# Patient Record
Sex: Female | Born: 1985
Health system: Southern US, Community
[De-identification: ages and names within clinical notes are randomized; demographics above are authoritative.]

## PROBLEM LIST (undated history)

## (undated) ENCOUNTER — Inpatient Hospital Stay (HOSPITAL_COMMUNITY): Payer: Self-pay

## (undated) DIAGNOSIS — F419 Anxiety disorder, unspecified: Secondary | ICD-10-CM

## (undated) DIAGNOSIS — D352 Benign neoplasm of pituitary gland: Secondary | ICD-10-CM

## (undated) DIAGNOSIS — K589 Irritable bowel syndrome without diarrhea: Secondary | ICD-10-CM

## (undated) HISTORY — DX: Anxiety disorder, unspecified: F41.9

## (undated) HISTORY — PX: TONSILLECTOMY: SUR1361

## (undated) HISTORY — PX: OTHER SURGICAL HISTORY: SHX169

## (undated) HISTORY — PX: TONSILLECTOMY AND ADENOIDECTOMY: SUR1326

## (undated) HISTORY — PX: WISDOM TOOTH EXTRACTION: SHX21

## (undated) HISTORY — DX: Irritable bowel syndrome, unspecified: K58.9

---
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
## 2005-06-30 ENCOUNTER — Emergency Department (HOSPITAL_COMMUNITY): Admission: EM | Admit: 2005-06-30 | Discharge: 2005-07-01 | Payer: Self-pay | Admitting: Emergency Medicine

## 2009-02-27 ENCOUNTER — Encounter: Admission: RE | Admit: 2009-02-27 | Discharge: 2009-02-27 | Payer: Self-pay | Admitting: Orthopedic Surgery

## 2009-08-24 ENCOUNTER — Ambulatory Visit: Payer: Self-pay | Admitting: Radiology

## 2009-08-24 ENCOUNTER — Emergency Department (HOSPITAL_BASED_OUTPATIENT_CLINIC_OR_DEPARTMENT_OTHER): Admission: EM | Admit: 2009-08-24 | Discharge: 2009-08-24 | Payer: Self-pay | Admitting: Emergency Medicine

## 2014-01-31 DIAGNOSIS — R5383 Other fatigue: Secondary | ICD-10-CM | POA: Insufficient documentation

## 2015-02-26 ENCOUNTER — Encounter: Payer: Self-pay | Admitting: Internal Medicine

## 2015-03-11 ENCOUNTER — Ambulatory Visit (INDEPENDENT_AMBULATORY_CARE_PROVIDER_SITE_OTHER): Payer: Managed Care, Other (non HMO) | Admitting: Internal Medicine

## 2015-03-11 ENCOUNTER — Encounter: Payer: Self-pay | Admitting: Internal Medicine

## 2015-03-11 VITALS — BP 126/70 | HR 80 | Temp 97.1°F | Resp 16 | Ht 63.0 in | Wt 134.6 lb

## 2015-03-11 DIAGNOSIS — B079 Viral wart, unspecified: Secondary | ICD-10-CM | POA: Diagnosis not present

## 2015-03-11 DIAGNOSIS — B078 Other viral warts: Secondary | ICD-10-CM

## 2015-03-11 DIAGNOSIS — M79675 Pain in left toe(s): Secondary | ICD-10-CM

## 2015-03-11 NOTE — Progress Notes (Signed)
  Subjective:    Patient ID: Joyce Chapman, female    DOB: 11-15-1985, 29 y.o.   MRN: [id number]  HPI   Patient presents with c/o pain in the L 1st toe site of remote excision of a wart about 3 years ago. Reports has noted over 1-2 months the development of a tender lump.   On no meds & denies med allergies.   Neg PMHX.   Review of Systems 10 point systems review negative except as above.    Objective:   Physical Exam  BP 126/70 mmHg  Pulse 80  Temp(Src) 97.1 F (36.2 C)  Resp 16  Ht 5\' 3"  (1.6 m)  Wt 134 lb 9.6 oz (61.054 kg)  BMI 23.85 kg/m2  There is a pink firm exophytic 8 mm x 8 mm dermal nodule in the plantar distal L 1st toe pad.   Procedure (CPT [phone]) After informed consent and aseptic alcohol prep, the area was anesthetized with 1.5 ml lidocaine 1%. Then with a # 10 scalpel the skin lesion was excised in toto in an elliptical fashion. The wound edges were approximated and everted w/ #3 vertical mattress sutures of Nylon 3-0. Then the wound edges were aligned with #3 interrupted sutures of Nylon 3-0 . Wound was sealed with "New Skin" and sterile bandage was applied.     Assessment & Plan:   1. Pain in toe of left foot   2. Verruca   - excised   - ROV 10 days for suture removal

## 2015-03-19 ENCOUNTER — Encounter: Payer: Self-pay | Admitting: Internal Medicine

## 2015-03-19 ENCOUNTER — Ambulatory Visit (INDEPENDENT_AMBULATORY_CARE_PROVIDER_SITE_OTHER): Payer: Self-pay | Admitting: Internal Medicine

## 2015-03-19 VITALS — BP 114/72 | HR 72 | Temp 97.8°F | Resp 16 | Ht 63.0 in | Wt 136.4 lb

## 2015-03-19 DIAGNOSIS — B078 Other viral warts: Secondary | ICD-10-CM

## 2015-03-19 DIAGNOSIS — B079 Viral wart, unspecified: Secondary | ICD-10-CM

## 2015-03-19 NOTE — Progress Notes (Signed)
  Subjective:    Patient ID: Joyce Chapman, female    DOB: [birth year], 29 y.o.   MRN: [id number]  HPI 9 days s/p excision of a large verucca of the volar L 1st toe.   Review of Systems    Objective:   Physical Exam  BP 114/72 mmHg  Pulse 72  Temp(Src) 97.8 F (36.6 C)  Resp 16  Ht 5\' 3"  (1.6 m)  Wt 136 lb 6.4 oz (61.871 kg)  BMI 24.17 kg/m2  Wound appears clean w/o sign of infection, but after removal of all # 5 sutures the wound margins separated revealing healthy granulation tissue. Wound edges wer approximated with taping.     Assessment & Plan:   Healing wound L 1st toe  - Patient instructed in wound care for healing by 2sd intention  - ROV prn

## 2015-10-22 LAB — OB RESULTS CONSOLE HIV ANTIBODY (ROUTINE TESTING): HIV: NONREACTIVE

## 2015-10-22 LAB — OB RESULTS CONSOLE RPR: RPR: NONREACTIVE

## 2015-10-22 LAB — OB RESULTS CONSOLE RUBELLA ANTIBODY, IGM: RUBELLA: IMMUNE

## 2015-10-22 LAB — OB RESULTS CONSOLE ABO/RH: RH Type: POSITIVE

## 2015-10-22 LAB — OB RESULTS CONSOLE HEPATITIS B SURFACE ANTIGEN: HEP B S AG: NEGATIVE

## 2015-10-22 LAB — OB RESULTS CONSOLE ANTIBODY SCREEN: Antibody Screen: NEGATIVE

## 2015-10-27 LAB — OB RESULTS CONSOLE GC/CHLAMYDIA
CHLAMYDIA, DNA PROBE: NEGATIVE
Gonorrhea: NEGATIVE

## 2015-10-30 ENCOUNTER — Other Ambulatory Visit: Payer: Self-pay | Admitting: Obstetrics & Gynecology

## 2015-10-30 DIAGNOSIS — H539 Unspecified visual disturbance: Secondary | ICD-10-CM

## 2015-11-08 ENCOUNTER — Encounter: Payer: Self-pay | Admitting: Radiology

## 2015-11-08 ENCOUNTER — Ambulatory Visit
Admission: RE | Admit: 2015-11-08 | Discharge: 2015-11-08 | Disposition: A | Discharge status: Home / Self Care | Payer: Self-pay | Source: Ambulatory Visit | Attending: Obstetrics & Gynecology | Admitting: Obstetrics & Gynecology

## 2015-11-08 DIAGNOSIS — H539 Unspecified visual disturbance: Secondary | ICD-10-CM

## 2015-11-13 ENCOUNTER — Ambulatory Visit (HOSPITAL_COMMUNITY): Payer: Self-pay

## 2015-11-17 ENCOUNTER — Encounter (HOSPITAL_COMMUNITY): Payer: Self-pay | Admitting: *Deleted

## 2015-11-17 ENCOUNTER — Other Ambulatory Visit (HOSPITAL_COMMUNITY): Payer: Self-pay | Admitting: Obstetrics and Gynecology

## 2015-11-17 ENCOUNTER — Ambulatory Visit (HOSPITAL_COMMUNITY)
Admission: RE | Admit: 2015-11-17 | Discharge: 2015-11-17 | Disposition: A | Discharge status: Home / Self Care | Payer: 59 | Source: Ambulatory Visit | Attending: Obstetrics & Gynecology | Admitting: Obstetrics & Gynecology

## 2015-11-17 ENCOUNTER — Other Ambulatory Visit (HOSPITAL_COMMUNITY): Payer: Self-pay

## 2015-11-17 VITALS — BP 109/60 | HR 82 | Wt 140.0 lb

## 2015-11-17 DIAGNOSIS — D352 Benign neoplasm of pituitary gland: Principal | ICD-10-CM

## 2015-11-17 DIAGNOSIS — O26899 Other specified pregnancy related conditions, unspecified trimester: Secondary | ICD-10-CM | POA: Diagnosis not present

## 2015-11-17 DIAGNOSIS — D369 Benign neoplasm, unspecified site: Secondary | ICD-10-CM

## 2015-11-17 DIAGNOSIS — Z3A15 15 weeks gestation of pregnancy: Secondary | ICD-10-CM

## 2015-11-17 HISTORY — DX: Benign neoplasm of pituitary gland: D35.2

## 2015-11-17 MED ORDER — CABERGOLINE 0.5 MG PO TABS: 0.5000 mg | ORAL_TABLET | ORAL | 0 refills | Status: DC

## 2015-11-17 NOTE — Progress Notes (Signed)
MFM Consult, staff note:    By way of consultation, I spoke to your patient regarding her diagnosis of pituitary macroadenoma.  She is currently experiencing headaches and blurry vision.  Her recent MRI demonstrates a macroadenoma with a max dimension of 27mm.  She stopped her cabergoline on 08/01/15 due to concerns of risks to her baby.  She recently restarted on 11/07/15 and has 2 left over tablets but does not have a prescription/refill.    I explained to her that in general carbergoline is effective for most patients and that there is no definitive evidence of fetal harm.  I recommended that she continue the cabergoline 0.5mg  po twice weekly and refilled the prescription.    Given that I am only seeing her in consultation (ie, she feels comfortable continuing OB care under your direction), she requires referral from your office to see Endocrinology.  This will enable her to have continuity of follow up of the effectiveness of the cabergoline as dosed.  I did not place the referral so that the correspondence be made with your office.    Secondly, as a precaution, I would also recommend that she have referral to ophthalmology (fundoscopic examination) given her symptoms.  It is likely that she will respond to the cabergoline but if not or if symptoms continue then she will require referral to neurosurgery for transphenoidal resection.  I detailed this to her and assured her that with careful follow up and monitoring, the treatment will be effective and I do not foresee risk of exposure to her fetus.  That being said, I would recommend reimaging by brain MRI in 2 months to reassess the macroadenoma size and response to the cabergoline.  While I do not foresee effect on fetal growth, we typically still follow fetal growth in women on cabergoline due to theoretical risks.  On the contrary, I did explain to her that cabergoline will reduce breast milk production in the postpartum period.  While it is likely  that the macroadenoma will decrease with treatment during pregnancy along with her symptoms, I would discourage stopping the medication should her prolactinoma persist in its current stage as the risks would outweigh benefit of breastfeeding.  Should the symptoms totally resolve and the macroadenoma decrease significantly, she may consider a period of 3-6 months off of medication postpartum to facilitate breastfeeding.  However, currently she does not meet acceptable criteria to be without treatment.  Summary of Recommendations: 1. Cabergoline 0.5mg  po twice weekly; (10 tablets, no refills sent; to bridge her to Endocrinology) 2. Endocrinology and Ophthalmology consult requests by your office are recommended (otherwise, please notify our practice and we can transfer care to our [facility] location and make the referral through our [facility] Endocrinology).  The patient truly desires primiary OB follow up with your office, and I feel this is reasonable with the appropriate co-managment as delineated. 3. If symptoms progress/persist, recommend transfer of care to [facility] MFM whereby we can facilitate re-imaging, referral to Neurosurgery, endocrinology, Ophthalmology as felt appropriate at that time. 4. Interval growth q6 weeks 5. Repeat MRI in 2 months 6. Endocrinology/Opthalmology consults would be best set up in the next week or two.   7.  Our office is happy to continue to remain involved as medical consultants with the ultrasounds for co-management (ie, if desired please order follow up consultations to coincide with the ultrasounds)  Time Spent:  I spent in excess of 80 minutes in consultation with this patient to review records, evaluate  her case, and provide her with an adequate discussion and education.  More than 50% of this time was spent in direct face-to-face counseling.  It was a pleasure seeing your patient in the office today.  Thank you for consultation. Please do not hesitate to  contact our service for any further questions.  Thank you,  Delman Cheadle Harl Favor, Delman Cheadle, MD, MS, FACOG Assistant Professor Section of [place]

## 2015-11-23 ENCOUNTER — Other Ambulatory Visit: Payer: Self-pay | Admitting: Obstetrics & Gynecology

## 2015-11-23 DIAGNOSIS — D352 Benign neoplasm of pituitary gland: Secondary | ICD-10-CM

## 2015-11-25 ENCOUNTER — Encounter (HOSPITAL_COMMUNITY): Payer: Self-pay

## 2015-11-25 ENCOUNTER — Other Ambulatory Visit (HOSPITAL_COMMUNITY): Payer: Self-pay

## 2015-12-08 ENCOUNTER — Encounter (HOSPITAL_COMMUNITY): Payer: Self-pay

## 2015-12-08 ENCOUNTER — Ambulatory Visit (HOSPITAL_COMMUNITY)
Admission: RE | Admit: 2015-12-08 | Discharge: 2015-12-08 | Disposition: A | Discharge status: Home / Self Care | Payer: 59 | Source: Ambulatory Visit | Attending: Obstetrics & Gynecology | Admitting: Obstetrics & Gynecology

## 2015-12-08 ENCOUNTER — Other Ambulatory Visit (HOSPITAL_COMMUNITY): Payer: Self-pay | Admitting: Obstetrics and Gynecology

## 2015-12-08 DIAGNOSIS — Z36 Encounter for antenatal screening of mother: Secondary | ICD-10-CM | POA: Diagnosis not present

## 2015-12-08 DIAGNOSIS — D369 Benign neoplasm, unspecified site: Secondary | ICD-10-CM

## 2015-12-08 DIAGNOSIS — D352 Benign neoplasm of pituitary gland: Secondary | ICD-10-CM

## 2015-12-08 DIAGNOSIS — O2692 Pregnancy related conditions, unspecified, second trimester: Secondary | ICD-10-CM

## 2015-12-08 DIAGNOSIS — Z1389 Encounter for screening for other disorder: Secondary | ICD-10-CM

## 2015-12-08 DIAGNOSIS — Z363 Encounter for antenatal screening for malformations: Secondary | ICD-10-CM

## 2015-12-08 DIAGNOSIS — Z3A18 18 weeks gestation of pregnancy: Secondary | ICD-10-CM

## 2016-01-09 ENCOUNTER — Other Ambulatory Visit: Payer: 59

## 2016-01-13 DIAGNOSIS — D352 Benign neoplasm of pituitary gland: Secondary | ICD-10-CM | POA: Insufficient documentation

## 2016-01-13 DIAGNOSIS — E221 Hyperprolactinemia: Secondary | ICD-10-CM | POA: Insufficient documentation

## 2016-01-30 ENCOUNTER — Ambulatory Visit
Admission: RE | Admit: 2016-01-30 | Discharge: 2016-01-30 | Disposition: A | Discharge status: Home / Self Care | Payer: 59 | Source: Ambulatory Visit | Attending: Obstetrics & Gynecology | Admitting: Obstetrics & Gynecology

## 2016-01-30 DIAGNOSIS — D352 Benign neoplasm of pituitary gland: Secondary | ICD-10-CM

## 2016-02-02 ENCOUNTER — Other Ambulatory Visit (HOSPITAL_COMMUNITY): Payer: Self-pay | Admitting: Obstetrics and Gynecology

## 2016-02-02 ENCOUNTER — Ambulatory Visit (HOSPITAL_COMMUNITY)
Admission: RE | Admit: 2016-02-02 | Discharge: 2016-02-02 | Disposition: A | Discharge status: Home / Self Care | Payer: 59 | Source: Ambulatory Visit | Attending: Obstetrics & Gynecology | Admitting: Obstetrics & Gynecology

## 2016-02-02 ENCOUNTER — Encounter (HOSPITAL_COMMUNITY): Payer: Self-pay

## 2016-02-02 DIAGNOSIS — D369 Benign neoplasm, unspecified site: Secondary | ICD-10-CM

## 2016-02-02 DIAGNOSIS — D352 Benign neoplasm of pituitary gland: Principal | ICD-10-CM

## 2016-02-02 DIAGNOSIS — O2692 Pregnancy related conditions, unspecified, second trimester: Secondary | ICD-10-CM | POA: Diagnosis present

## 2016-02-02 DIAGNOSIS — Z3A26 26 weeks gestation of pregnancy: Secondary | ICD-10-CM | POA: Insufficient documentation

## 2016-03-29 ENCOUNTER — Encounter (HOSPITAL_COMMUNITY): Payer: Self-pay

## 2016-03-29 ENCOUNTER — Ambulatory Visit (HOSPITAL_COMMUNITY)
Admission: RE | Admit: 2016-03-29 | Discharge: 2016-03-29 | Disposition: A | Discharge status: Home / Self Care | Payer: 59 | Source: Ambulatory Visit | Attending: Obstetrics & Gynecology | Admitting: Obstetrics & Gynecology

## 2016-03-29 DIAGNOSIS — O2693 Pregnancy related conditions, unspecified, third trimester: Secondary | ICD-10-CM | POA: Diagnosis present

## 2016-03-29 DIAGNOSIS — Z3A34 34 weeks gestation of pregnancy: Secondary | ICD-10-CM | POA: Insufficient documentation

## 2016-03-29 DIAGNOSIS — D369 Benign neoplasm, unspecified site: Secondary | ICD-10-CM

## 2016-03-29 DIAGNOSIS — D352 Benign neoplasm of pituitary gland: Secondary | ICD-10-CM

## 2016-04-22 DIAGNOSIS — R52 Pain, unspecified: Secondary | ICD-10-CM | POA: Diagnosis not present

## 2016-04-22 DIAGNOSIS — J00 Acute nasopharyngitis [common cold]: Secondary | ICD-10-CM | POA: Diagnosis not present

## 2016-04-22 DIAGNOSIS — J029 Acute pharyngitis, unspecified: Secondary | ICD-10-CM | POA: Diagnosis not present

## 2016-05-03 ENCOUNTER — Inpatient Hospital Stay (EMERGENCY_DEPARTMENT_HOSPITAL)
Admission: AD | Admit: 2016-05-03 | Discharge: 2016-05-03 | Disposition: A | Discharge status: Home / Self Care | Payer: 59 | Source: Ambulatory Visit | Attending: Obstetrics and Gynecology | Admitting: Obstetrics and Gynecology

## 2016-05-03 ENCOUNTER — Encounter (HOSPITAL_COMMUNITY): Payer: Self-pay

## 2016-05-03 DIAGNOSIS — Z3A39 39 weeks gestation of pregnancy: Secondary | ICD-10-CM | POA: Insufficient documentation

## 2016-05-03 DIAGNOSIS — O26893 Other specified pregnancy related conditions, third trimester: Secondary | ICD-10-CM | POA: Insufficient documentation

## 2016-05-03 DIAGNOSIS — Z87891 Personal history of nicotine dependence: Secondary | ICD-10-CM | POA: Insufficient documentation

## 2016-05-03 DIAGNOSIS — O479 False labor, unspecified: Secondary | ICD-10-CM

## 2016-05-03 DIAGNOSIS — N898 Other specified noninflammatory disorders of vagina: Secondary | ICD-10-CM

## 2016-05-03 DIAGNOSIS — Z3493 Encounter for supervision of normal pregnancy, unspecified, third trimester: Secondary | ICD-10-CM | POA: Diagnosis not present

## 2016-05-03 NOTE — MAU Note (Signed)
Been having contractions since 0700, more frequent than they have been last few days. Now every 8 min.  Has been losing mucous last few days.  Pinkish fluid noted today. Was closed last week

## 2016-05-03 NOTE — Discharge Instructions (Signed)
Braxton Hicks Contractions °Contractions of the uterus can occur throughout pregnancy. Contractions are not always a sign that you are in labor.  °WHAT ARE BRAXTON HICKS CONTRACTIONS?  °Contractions that occur before labor are called Braxton Hicks contractions, or false labor. Toward the end of pregnancy (32-34 weeks), these contractions can develop more often and may become more forceful. This is not true labor because these contractions do not result in opening (dilatation) and thinning of the cervix. They are sometimes difficult to tell apart from true labor because these contractions can be forceful and people have different pain tolerances. You should not feel embarrassed if you go to the hospital with false labor. Sometimes, the only way to tell if you are in true labor is for your health care provider to look for changes in the cervix. °If there are no prenatal problems or other health problems associated with the pregnancy, it is completely safe to be sent home with false labor and await the onset of true labor. °HOW CAN YOU TELL THE DIFFERENCE BETWEEN TRUE AND FALSE LABOR? °False Labor  °· The contractions of false labor are usually shorter and not as hard as those of true labor.   °· The contractions are usually irregular.   °· The contractions are often felt in the front of the lower abdomen and in the groin.   °· The contractions may go away when you walk around or change positions while lying down.   °· The contractions get weaker and are shorter lasting as time goes on.   °· The contractions do not usually become progressively stronger, regular, and closer together as with true labor.   °True Labor  °· Contractions in true labor last 30-70 seconds, become very regular, usually become more intense, and increase in frequency.   °· The contractions do not go away with walking.   °· The discomfort is usually felt in the top of the uterus and spreads to the lower abdomen and low back.   °· True labor can be  determined by your health care provider with an exam. This will show that the cervix is dilating and getting thinner.   °WHAT TO REMEMBER °· Keep up with your usual exercises and follow other instructions given by your health care provider.   °· Take medicines as directed by your health care provider.   °· Keep your regular prenatal appointments.   °· Eat and drink lightly if you think you are going into labor.   °· If Braxton Hicks contractions are making you uncomfortable:   °¨ Change your position from lying down or resting to walking, or from walking to resting.   °¨ Sit and rest in a tub of warm water.   °¨ Drink 2-3 glasses of water. Dehydration may cause these contractions.   °¨ Do slow and deep breathing several times an hour.   °WHEN SHOULD I SEEK IMMEDIATE MEDICAL CARE? °Seek immediate medical care if: °· Your contractions become stronger, more regular, and closer together.   °· You have fluid leaking or gushing from your vagina.   °· You have a fever.   °· You pass blood-tinged mucus.   °· You have vaginal bleeding.   °· You have continuous abdominal pain.   °· You have low back pain that you never had before.   °· You feel your baby's head pushing down and causing pelvic pressure.   °· Your baby is not moving as much as it used to.   °This information is not intended to replace advice given to you by your health care provider. Make sure you discuss any questions you have with your health care   provider. °Document Released: 04/04/2005 Document Revised: 07/27/2015 Document Reviewed: 01/14/2013 °Elsevier Interactive Patient Education © 2017 [employer] Inc. ° °

## 2016-05-03 NOTE — MAU Provider Note (Signed)
History   [id number]   No chief complaint on file.   HPI Joyce Chapman is a 31 y.o. female  G1P0 here with report of watery vaginal discharge that began today.  Leaking of fluid has not continued.  Pt reports contractions and denies vaginal bleeding.    +fetal movement.   All other systems negative.    Patient's last menstrual period was 08/01/2015.  OB History  Gravida Para Term Preterm AB Living  1            SAB TAB Ectopic Multiple Live Births               # Outcome Date GA Lbr Len/2nd Weight Sex Delivery Anes PTL Lv  1 Current               Past Medical History:  Diagnosis Date  . Prolactinoma ([place])     History reviewed. No pertinent family history.  Social History   Social History  . Marital status: Single    Spouse name: N/A  . Number of children: N/A  . Years of education: N/A   Social History Main Topics  . Smoking status: Former Smoker    Packs/day: 0.05    Types: Cigarettes  . Smokeless tobacco: Never Used  . Alcohol use 0.0 oz/week     Comment: social  . Drug use: No  . Sexual activity: Yes    Birth control/ protection: None   Other Topics Concern  . None   Social History Narrative  . None    Allergies  Allergen Reactions  . Hydrocodone Itching  . Sudafed [Pseudoephedrine Hcl] Palpitations    No current facility-administered medications on file prior to encounter.    Current Outpatient Prescriptions on File Prior to Encounter  Medication Sig Dispense Refill  . Prenatal Vit-Fe Fumarate-FA (PRENATAL VITAMIN PO) Take by mouth.    . cabergoline (DOSTINEX) 0.5 MG tablet Take 1 tablet (0.5 mg total) by mouth 2 (two) times a week. (Patient not taking: Reported on 05/03/2016) 10 tablet 0  . medroxyPROGESTERone (PROVERA) 10 MG tablet Take 10 mg by mouth.       Review of Systems  Constitutional: Negative for chills and fever.  Gastrointestinal: Positive for abdominal pain. Negative for constipation, diarrhea, nausea and vomiting.   Genitourinary: Positive for vaginal discharge. Negative for dysuria and vaginal bleeding.     Physical Exam   Vitals:   05/03/16 1231 05/03/16 1317 05/03/16 1404  BP: 115/64  108/80  Pulse: 85  71  Resp: 18 18 18   Temp: 98.1 F (36.7 C)  98.3 F (36.8 C)  TempSrc: Oral  Oral  Weight: 163 lb 4 oz (74 kg)      Physical Exam  Constitutional: She is oriented to person, place, and time. She appears well-developed and well-nourished. No distress.  HENT:  Head: Normocephalic.  Cardiovascular: Normal rate and regular rhythm.   Respiratory: Effort normal. No respiratory distress.  GI: Soft. There is no tenderness.  Genitourinary:  Genitourinary Comments: Sterile speculum exam No pooling No ferning Dilation: Fingertip Effacement (%): 80 Exam by:: Hansel Feinstein CNM   Musculoskeletal: Normal range of motion.  Neurological: She is alert and oriented to person, place, and time.  Skin: Skin is warm and dry.  Psychiatric: She has a normal mood and affect.    MAU Course  Procedures  MDM Dr Helane Rima came by and saw patient Will discharge home with labor and ROM precautions  Assessment and Plan  SIUP at  [redacted]w[redacted]d Vaginal discharge, no evidence of ROM Not in labor but more effaced  Discharge home Labor precautions Follow up in office or in MAU PRN   Seabron Spates, CNM 05/03/2016 2:19 PM

## 2016-05-04 ENCOUNTER — Inpatient Hospital Stay (HOSPITAL_COMMUNITY): Payer: 59 | Admitting: Anesthesiology

## 2016-05-04 ENCOUNTER — Inpatient Hospital Stay (HOSPITAL_COMMUNITY)
Admission: AD | Admit: 2016-05-04 | Discharge: 2016-05-06 | DRG: 775 | Disposition: A | Discharge status: Home / Self Care | Payer: 59 | Source: Ambulatory Visit | Attending: Obstetrics and Gynecology | Admitting: Obstetrics and Gynecology

## 2016-05-04 ENCOUNTER — Encounter (HOSPITAL_COMMUNITY): Payer: Self-pay | Admitting: *Deleted

## 2016-05-04 DIAGNOSIS — O9962 Diseases of the digestive system complicating childbirth: Secondary | ICD-10-CM | POA: Diagnosis present

## 2016-05-04 DIAGNOSIS — Z3493 Encounter for supervision of normal pregnancy, unspecified, third trimester: Secondary | ICD-10-CM | POA: Diagnosis not present

## 2016-05-04 DIAGNOSIS — K219 Gastro-esophageal reflux disease without esophagitis: Secondary | ICD-10-CM | POA: Diagnosis not present

## 2016-05-04 DIAGNOSIS — O4292 Full-term premature rupture of membranes, unspecified as to length of time between rupture and onset of labor: Secondary | ICD-10-CM | POA: Diagnosis present

## 2016-05-04 DIAGNOSIS — Z87891 Personal history of nicotine dependence: Secondary | ICD-10-CM | POA: Diagnosis not present

## 2016-05-04 DIAGNOSIS — Z3A39 39 weeks gestation of pregnancy: Secondary | ICD-10-CM

## 2016-05-04 LAB — CBC
HEMATOCRIT: 35.3 % — AB (ref 36.0–46.0)
HEMOGLOBIN: 12.5 g/dL (ref 12.0–15.0)
MCH: 31.1 pg (ref 26.0–34.0)
MCHC: 35.4 g/dL (ref 30.0–36.0)
MCV: 87.8 fL (ref 78.0–100.0)
PLATELETS: 237 10*3/uL (ref 150–400)
RBC: 4.02 MIL/uL (ref 3.87–5.11)
RDW: 14.6 % (ref 11.5–15.5)
WBC: 16 10*3/uL — AB (ref 4.0–10.5)

## 2016-05-04 LAB — TYPE AND SCREEN
ABO/RH(D): B POS
ANTIBODY SCREEN: NEGATIVE

## 2016-05-04 LAB — ABO/RH: ABO/RH(D): B POS

## 2016-05-04 MED ORDER — ACETAMINOPHEN 325 MG PO TABS
650.0000 mg | ORAL_TABLET | ORAL | Status: DC | PRN
  Administered 2016-05-04 – 2016-05-05 (×2): 650 mg via ORAL
  Filled 2016-05-04 (×2): qty 2

## 2016-05-04 MED ORDER — EPHEDRINE 5 MG/ML INJ
10.0000 mg | INTRAVENOUS | Status: DC | PRN
  Filled 2016-05-04: qty 4

## 2016-05-04 MED ORDER — OXYTOCIN BOLUS FROM INFUSION
500.0000 mL | Freq: Once | INTRAVENOUS | Status: AC
  Administered 2016-05-04: 500 mL via INTRAVENOUS

## 2016-05-04 MED ORDER — OXYCODONE-ACETAMINOPHEN 5-325 MG PO TABS
1.0000 | ORAL_TABLET | ORAL | Status: DC | PRN
  Administered 2016-05-05 – 2016-05-06 (×6): 1 via ORAL
  Filled 2016-05-04 (×6): qty 1

## 2016-05-04 MED ORDER — SIMETHICONE 80 MG PO CHEW
80.0000 mg | CHEWABLE_TABLET | ORAL | Status: DC | PRN
  Administered 2016-05-05: 80 mg via ORAL

## 2016-05-04 MED ORDER — FENTANYL 2.5 MCG/ML BUPIVACAINE 1/10 % EPIDURAL INFUSION (WH - ANES)
14.0000 mL/h | INTRAMUSCULAR | Status: DC | PRN
  Administered 2016-05-04: 14 mL/h via EPIDURAL
  Filled 2016-05-04: qty 100

## 2016-05-04 MED ORDER — LACTATED RINGERS IV SOLN: 500.0000 mL | INTRAVENOUS | Status: DC | PRN

## 2016-05-04 MED ORDER — MEDROXYPROGESTERONE ACETATE 150 MG/ML IM SUSP: 150.0000 mg | INTRAMUSCULAR | Status: DC | PRN

## 2016-05-04 MED ORDER — DIPHENHYDRAMINE HCL 50 MG/ML IJ SOLN: 12.5000 mg | INTRAMUSCULAR | Status: DC | PRN

## 2016-05-04 MED ORDER — COCONUT OIL OIL
1.0000 "application " | TOPICAL_OIL | Status: DC | PRN
  Filled 2016-05-04: qty 120

## 2016-05-04 MED ORDER — PRENATAL MULTIVITAMIN CH
1.0000 | ORAL_TABLET | Freq: Every day | ORAL | Status: DC
  Administered 2016-05-05 – 2016-05-06 (×2): 1 via ORAL
  Filled 2016-05-04 (×2): qty 1

## 2016-05-04 MED ORDER — PHENYLEPHRINE 40 MCG/ML (10ML) SYRINGE FOR IV PUSH (FOR BLOOD PRESSURE SUPPORT)
80.0000 ug | PREFILLED_SYRINGE | INTRAVENOUS | Status: DC | PRN
  Filled 2016-05-04: qty 10
  Filled 2016-05-04: qty 5

## 2016-05-04 MED ORDER — ONDANSETRON HCL 4 MG/2ML IJ SOLN: 4.0000 mg | INTRAMUSCULAR | Status: DC | PRN

## 2016-05-04 MED ORDER — MEASLES, MUMPS & RUBELLA VAC ~~LOC~~ INJ
0.5000 mL | INJECTION | Freq: Once | SUBCUTANEOUS | Status: DC
  Filled 2016-05-04: qty 0.5

## 2016-05-04 MED ORDER — OXYTOCIN 40 UNITS IN LACTATED RINGERS INFUSION - SIMPLE MED
2.5000 [IU]/h | INTRAVENOUS | Status: DC
  Filled 2016-05-04: qty 1000

## 2016-05-04 MED ORDER — PHENYLEPHRINE 40 MCG/ML (10ML) SYRINGE FOR IV PUSH (FOR BLOOD PRESSURE SUPPORT)
80.0000 ug | PREFILLED_SYRINGE | INTRAVENOUS | Status: DC | PRN
  Filled 2016-05-04: qty 5

## 2016-05-04 MED ORDER — LIDOCAINE HCL (PF) 1 % IJ SOLN
30.0000 mL | INTRAMUSCULAR | Status: DC | PRN
  Filled 2016-05-04: qty 30

## 2016-05-04 MED ORDER — LACTATED RINGERS IV SOLN: 500.0000 mL | Freq: Once | INTRAVENOUS | Status: DC

## 2016-05-04 MED ORDER — DIBUCAINE 1 % RE OINT
1.0000 "application " | TOPICAL_OINTMENT | RECTAL | Status: DC | PRN
  Administered 2016-05-06: 1 via RECTAL
  Filled 2016-05-04: qty 28

## 2016-05-04 MED ORDER — BUTORPHANOL TARTRATE 1 MG/ML IJ SOLN
1.0000 mg | INTRAMUSCULAR | Status: DC | PRN
  Administered 2016-05-04 (×2): 1 mg via INTRAVENOUS
  Filled 2016-05-04 (×3): qty 1

## 2016-05-04 MED ORDER — ACETAMINOPHEN 325 MG PO TABS: 650.0000 mg | ORAL_TABLET | ORAL | Status: DC | PRN

## 2016-05-04 MED ORDER — IBUPROFEN 600 MG PO TABS
600.0000 mg | ORAL_TABLET | Freq: Four times a day (QID) | ORAL | Status: DC
  Administered 2016-05-05 – 2016-05-06 (×7): 600 mg via ORAL
  Filled 2016-05-04 (×7): qty 1

## 2016-05-04 MED ORDER — TETANUS-DIPHTH-ACELL PERTUSSIS 5-2.5-18.5 LF-MCG/0.5 IM SUSP: 0.5000 mL | Freq: Once | INTRAMUSCULAR | Status: DC

## 2016-05-04 MED ORDER — TERBUTALINE SULFATE 1 MG/ML IJ SOLN
0.2500 mg | Freq: Once | INTRAMUSCULAR | Status: DC | PRN
  Filled 2016-05-04: qty 1

## 2016-05-04 MED ORDER — ONDANSETRON HCL 4 MG PO TABS: 4.0000 mg | ORAL_TABLET | ORAL | Status: DC | PRN

## 2016-05-04 MED ORDER — SOD CITRATE-CITRIC ACID 500-334 MG/5ML PO SOLN: 30.0000 mL | ORAL | Status: DC | PRN

## 2016-05-04 MED ORDER — BENZOCAINE-MENTHOL 20-0.5 % EX AERO
1.0000 "application " | INHALATION_SPRAY | CUTANEOUS | Status: DC | PRN
  Administered 2016-05-05: 1 via TOPICAL
  Filled 2016-05-04 (×2): qty 56

## 2016-05-04 MED ORDER — WITCH HAZEL-GLYCERIN EX PADS
1.0000 "application " | MEDICATED_PAD | CUTANEOUS | Status: DC | PRN
  Administered 2016-05-06: 1 via TOPICAL

## 2016-05-04 MED ORDER — SENNOSIDES-DOCUSATE SODIUM 8.6-50 MG PO TABS
2.0000 | ORAL_TABLET | ORAL | Status: DC
  Administered 2016-05-05 (×2): 2 via ORAL
  Filled 2016-05-04 (×2): qty 2

## 2016-05-04 MED ORDER — ONDANSETRON HCL 4 MG/2ML IJ SOLN
4.0000 mg | Freq: Four times a day (QID) | INTRAMUSCULAR | Status: DC | PRN
  Administered 2016-05-04 (×2): 4 mg via INTRAVENOUS
  Filled 2016-05-04 (×2): qty 2

## 2016-05-04 MED ORDER — OXYTOCIN 40 UNITS IN LACTATED RINGERS INFUSION - SIMPLE MED
1.0000 m[IU]/min | INTRAVENOUS | Status: DC
  Administered 2016-05-04: 2 m[IU]/min via INTRAVENOUS

## 2016-05-04 MED ORDER — PANTOPRAZOLE SODIUM 40 MG PO TBEC
40.0000 mg | DELAYED_RELEASE_TABLET | Freq: Every day | ORAL | Status: DC
  Administered 2016-05-05 – 2016-05-06 (×2): 40 mg via ORAL
  Filled 2016-05-04 (×2): qty 1

## 2016-05-04 MED ORDER — OXYCODONE-ACETAMINOPHEN 5-325 MG PO TABS: 2.0000 | ORAL_TABLET | ORAL | Status: DC | PRN

## 2016-05-04 MED ORDER — DIPHENHYDRAMINE HCL 25 MG PO CAPS: 25.0000 mg | ORAL_CAPSULE | Freq: Four times a day (QID) | ORAL | Status: DC | PRN

## 2016-05-04 MED ORDER — FLEET ENEMA 7-19 GM/118ML RE ENEM: 1.0000 | ENEMA | RECTAL | Status: DC | PRN

## 2016-05-04 MED ORDER — LACTATED RINGERS IV SOLN
INTRAVENOUS | Status: DC
  Administered 2016-05-04: 06:00:00 via INTRAVENOUS

## 2016-05-04 MED ORDER — LIDOCAINE HCL (PF) 1 % IJ SOLN
INTRAMUSCULAR | Status: DC | PRN
  Administered 2016-05-04 (×2): 6 mL via EPIDURAL

## 2016-05-04 NOTE — Progress Notes (Signed)
Pt uncomfortable w/ ctx  FHT cat 1 Toco Q5 Cvx 8cm, no change  A/P:  Augment with pitocin Offered epidural

## 2016-05-04 NOTE — MAU Note (Signed)
Pt reports frequent contractions since 2300, light pink mucus discharge + FM

## 2016-05-04 NOTE — Anesthesia Procedure Notes (Signed)
Epidural Patient location during procedure: OB Start time: 05/04/2016 3:10 PM End time: 05/04/2016 4:25 PM  Staffing Anesthesiologist: Annye Asa Performed: anesthesiologist   Preanesthetic Checklist Completed: patient identified, surgical consent, pre-op evaluation, timeout performed, IV checked, risks and benefits discussed and monitors and equipment checked  Epidural Patient position: sitting Prep: site prepped and draped and DuraPrep Patient monitoring: blood pressure, continuous pulse ox and heart rate Approach: midline Location: L3-L4 Injection technique: LOR air  Needle:  Needle type: Tuohy  Needle gauge: 17 G Needle length: 9 cm Needle insertion depth: 5 cm Catheter type: closed end flexible Catheter size: 19 Gauge Catheter at skin depth: 10.5 cm Test dose: negative (1% lidocaine)  Assessment Events: blood not aspirated, injection not painful, no injection resistance, negative IV test and no paresthesia  Additional Notes Pt identified in Labor room.  Monitors applied. Working IV access confirmed. Sterile prep, drape lumbar spine.  1% lido local L 3,4.  #17ga Touhy LOR air at 5 cm L 3,4, cath in easily to 10.5 cm skin. Test dose OK, cath dosed and infusion begun.  Patient asymptomatic, VSS, no heme aspirated, tolerated well.  Jenita Seashore, MDReason for block:procedure for pain

## 2016-05-04 NOTE — Progress Notes (Signed)
SVD of vigorous female infant w/ apgars of 8,9.  Placenta delivered spontaneous w/ 3VC.   2nd degree & left periurethral lac repaired w/ 3-0 vicryl rapide.  Fundus firm.  EBL 250cc .

## 2016-05-04 NOTE — Progress Notes (Signed)
As I was performing epidural/pain rounds on L&D, I was asked by the L&D RN to not visit this patient, as she did not want to hear anything about an epidural.  I therefore did not visit this patient.  I told the L&D RN to call me/anesthesia if the patient changes her mind and wants to talk to anesthesia or learn about epidural.  Ladora Daniel CRNA

## 2016-05-04 NOTE — Progress Notes (Signed)
Pt uncomfortable w/ ctx.  Declines epidural.    FHT cat 1 Toco q4-6 Cvx 8/c/0 AROM - clear  A/P:  Continue exp mngt

## 2016-05-04 NOTE — MAU Note (Signed)
PT   SAYS SHE HAS BEEN UC   HURTING  BAD  SINCE  12MN.  VE IN OFFICE   WAS  1  CM.         HAS HX OF  HSV-  LAST OUTBREAK-  OVER  1 YEAR-   TAKES  VALTREX  DAILY  .   DENIES MRSA.     GBS-  NEG

## 2016-05-04 NOTE — Progress Notes (Signed)
MOB has been up twice to void. MOB is steady on her feet and denies dizzy/light headedness.

## 2016-05-04 NOTE — Anesthesia Preprocedure Evaluation (Signed)
Anesthesia Evaluation  Patient identified by MRN, date of birth, ID band Patient awake    Reviewed: Allergy & Precautions, NPO status , Patient's Chart, lab work & pertinent test results  History of Anesthesia Complications Negative for: history of anesthetic complications  Airway Mallampati: II  TM Distance: >3 FB Neck ROM: Full    Dental  (+) Dental Advisory Given   Pulmonary former smoker,    breath sounds clear to auscultation       Cardiovascular negative cardio ROS   Rhythm:Regular Rate:Normal     Neuro/Psych H/o prolactinoma: 1 cm, no optic sx or headaches, no n/v    GI/Hepatic Neg liver ROS, GERD  Medicated and Controlled,  Endo/Other  negative endocrine ROS  Renal/GU negative Renal ROS     Musculoskeletal   Abdominal   Peds  Hematology plt 237K    Anesthesia Other Findings   Reproductive/Obstetrics (+) Pregnancy                            Anesthesia Physical Anesthesia Plan  ASA: III  Anesthesia Plan: Epidural   Post-op Pain Management:    Induction:   Airway Management Planned: Natural Airway  Additional Equipment:   Intra-op Plan:   Post-operative Plan:   Informed Consent: I have reviewed the patients History and Physical, chart, labs and discussed the procedure including the risks, benefits and alternatives for the proposed anesthesia with the patient or authorized representative who has indicated his/her understanding and acceptance.     Plan Discussed with:   Anesthesia Plan Comments: (Patient identified. Risks/Benefits/Options discussed with patient including but not limited to bleeding, infection, nerve damage, paralysis, failed block, incomplete pain control, headache, blood pressure changes, nausea, vomiting, reactions to medication both or allergic, itching and postpartum back pain. Confirmed with bedside nurse the patient's most recent platelet count.  Confirmed with patient that they are not currently taking any anticoagulation, have any bleeding history or any family history of bleeding disorders. Pt has no symptoms of elevated ICP. Patient expressed understanding and wished to proceed. All questions were answered. )        Anesthesia Quick Evaluation

## 2016-05-04 NOTE — Progress Notes (Signed)
Pt a little more comfortable w/ ctx but feeling more pressure.  Has epidural now  FHT cat1 Toco Q2-4 Cvx 9.5/c/0  A/P:  Continue pitocin augmentation Exp mngt

## 2016-05-04 NOTE — H&P (Signed)
Joyce Chapman is a 31 y.o. female presenting for SOL.  Pt notes regular, painful contractions.  SROM following admission for clear, blood tinged fluid.    OB History    Gravida Para Term Preterm AB Living   1             SAB TAB Ectopic Multiple Live Births                 Past Medical History:  Diagnosis Date  . Prolactinoma [facility])    Past Surgical History:  Procedure Laterality Date  . TONSILECTOMY/ADENOIDECTOMY WITH MYRINGOTOMY    . WISDOM TOOTH EXTRACTION     Family History: family history is not on file. Social History:  reports that she has quit smoking. Her smoking use included Cigarettes. She smoked 0.05 packs per day. She has never used smokeless tobacco. She reports that she drinks alcohol. She reports that she does not use drugs.     Maternal Diabetes: No Genetic Screening: Declined Maternal Ultrasounds/Referrals: Normal Fetal Ultrasounds or other Referrals:  None, referred to MFM Maternal Substance Abuse:  No Significant Maternal Medications:  Meds include: Other: carbergoline Significant Maternal Lab Results:  None Other Comments:  None  ROS History Dilation: 5 Effacement (%): 90 Station: -1 Exam by:: Dr. Julien Girt Blood pressure 128/69, pulse 83, temperature 98.5 F (36.9 C), temperature source Axillary, resp. rate 20, height 5\' 3"  (1.6 m), weight 162 lb (73.5 kg), last menstrual period 08/01/2015, SpO2 97 %. Exam Physical Exam  Prenatal labs: ABO, Rh: --/--/B POS (01/17 0527) Antibody: NEG (01/17 0527) Rubella: Immune (07/06 0000) RPR: Nonreactive (07/06 0000)  HBsAg: Negative (07/06 0000)  HIV: Non-reactive (07/06 0000)  GBS:   negative  Assessment/Plan: Admit Epidural prn Exp mngt   Joyce Chapman 05/04/2016, 9:16 AM

## 2016-05-05 LAB — CBC
HCT: 30.2 % — ABNORMAL LOW (ref 36.0–46.0)
Hemoglobin: 10.4 g/dL — ABNORMAL LOW (ref 12.0–15.0)
MCH: 30.7 pg (ref 26.0–34.0)
MCHC: 34.4 g/dL (ref 30.0–36.0)
MCV: 89.1 fL (ref 78.0–100.0)
PLATELETS: 185 10*3/uL (ref 150–400)
RBC: 3.39 MIL/uL — ABNORMAL LOW (ref 3.87–5.11)
RDW: 15 % (ref 11.5–15.5)
WBC: 15 10*3/uL — AB (ref 4.0–10.5)

## 2016-05-05 LAB — RPR: RPR Ser Ql: NONREACTIVE

## 2016-05-05 NOTE — Progress Notes (Signed)
S: No complaints. Feeling well. Lochia appropriate. No subjective fevers/chills.   O:  Vitals:   05/04/16 2159 05/05/16 0346  BP: 108/61 (!) 112/51  Pulse: 87 70  Resp: 16 18  Temp: 98 F (36.7 C) 98.6 F (37 C)    Gen: NAD, A&O Pulm: NWOB Abd: soft, appropriately ttp, fundus firm and below Umb Ext: No evidence of DVT, trace edema b/l  Labs CBC    Component Value Date/Time   WBC 15.0 (H) 05/05/2016 0607   RBC 3.39 (L) 05/05/2016 0607   HGB 10.4 (L) 05/05/2016 0607   HCT 30.2 (L) 05/05/2016 0607   PLT 185 05/05/2016 0607   MCV 89.1 05/05/2016 0607   MCH 30.7 05/05/2016 0607   MCHC 34.4 05/05/2016 0607   RDW 15.0 05/05/2016 0607     A/P:  PPD1 s/p SVD, doing well pp. AFVSS. Benign exam.  Continue present care. Plan for d/c PPD#2.   Lucillie Garfinkel MD

## 2016-05-05 NOTE — Lactation Note (Signed)
This note was copied from a baby's chart. Lactation Consultation Note When [place] entered rm. Baby laying in crib w/no blanket on baby. Skin cool. Temp. 97.6 educated mom about temperature of baby. Mom stated FOB said no blankets can be on baby in crib. Spoke w/Rn about no coverings, and need of DEBP set up to induce lactation.  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 Mom will only be able to BF until symptoms show of tumor returning such as severe headaches then will have to stop BF.  Hand expression taught w/glisnening of colostrum. Mom encouraged to feed baby 8-12 times/24 hours and with feeding cues. Encouraged to call for assistance if needed and to verify proper latch. [place] brochure given w/resources, support groups and [place] services.  Patient Name: Joyce Chapman Today's Date: 05/05/2016 Reason for consult: Initial assessment   Maternal Data Has patient been taught Hand Expression?: Yes Does the patient have breastfeeding experience prior to this delivery?: No  Feeding    LATCH Score/Interventions Latch: Too sleepy or reluctant, no latch achieved, no sucking elicited. Intervention(s): Teach feeding cues;Waking techniques     Type of Nipple: Everted at rest and after stimulation  Comfort (Breast/Nipple): Filling, red/small blisters or bruises, mild/mod discomfort  Interventions (Filling): Massage        Lactation Tools Discussed/Used WIC Program: Yes   Consult Status Consult Status: Follow-up Date: 05/05/16 Follow-up type: In-patient    Theodoro Kalata 05/05/2016, 4:43 AM

## 2016-05-05 NOTE — Lactation Note (Signed)
This note was copied from a baby's chart. Lactation Consultation Note  Patient Name: Joyce Chapman [id number] Date: 05/05/2016 Reason for consult: Follow-up assessment Baby giving some feeding ques so assisted Mom with latching baby. Baby had few good suckling bursts then fell asleep, had recently BF for 20 minutes. Basic teaching reviewed with parents. Advised baby should be at breast 8-12 times in 24 hours and with feeding ques, cluster feeding discussed. Encouraged to keep baby nursing for 15-20 minutes both breasts some feedings. Mom denies any tenderness with nursing. Questions answered. Mom to call for assist as needed.   Maternal Data    Feeding Feeding Type: Breast Fed Length of feed: 3 min  LATCH Score/Interventions Latch: Grasps breast easily, tongue down, lips flanged, rhythmical sucking. Intervention(s): Skin to skin;Teach feeding cues;Waking techniques Intervention(s): Adjust position;Assist with latch;Breast massage;Breast compression  Audible Swallowing: None Intervention(s): Skin to skin;Hand expression  Type of Nipple: Everted at rest and after stimulation  Comfort (Breast/Nipple): Soft / non-tender  Interventions (Filling): Massage  Hold (Positioning): Assistance needed to correctly position infant at breast and maintain latch. Intervention(s): Breastfeeding basics reviewed;Support Pillows;Position options;Skin to skin  LATCH Score: 7  Lactation Tools Discussed/Used     Consult Status Consult Status: Follow-up Date: 05/06/16 Follow-up type: In-patient    Katrine Coho 05/05/2016, 10:00 PM

## 2016-05-05 NOTE — Anesthesia Postprocedure Evaluation (Signed)
Anesthesia Post Note  Patient: Joyce Chapman  Procedure(s) Performed: * No procedures listed *  Patient location during evaluation: Mother Baby Anesthesia Type: Epidural Level of consciousness: awake Pain management: pain level controlled Vital Signs Assessment: post-procedure vital signs reviewed and stable Respiratory status: spontaneous breathing Cardiovascular status: stable Postop Assessment: no headache, no backache, epidural receding, patient able to bend at knees, no signs of nausea or vomiting and adequate PO intake Anesthetic complications: no        Last Vitals:  Vitals:   05/04/16 2159 05/05/16 0346  BP: 108/61 (!) 112/51  Pulse: 87 70  Resp: 16 18  Temp: 36.7 C 37 C    Last Pain:  Vitals:   05/05/16 0641  TempSrc:   PainSc: 2    Pain Goal: Patients Stated Pain Goal: 7 (05/04/16 2159)               Barkley Boards

## 2016-05-06 MED ORDER — IBUPROFEN 600 MG PO TABS: 600.0000 mg | ORAL_TABLET | Freq: Four times a day (QID) | ORAL | 1 refills | Status: DC | PRN

## 2016-05-06 MED ORDER — ACETAMINOPHEN 325 MG PO TABS: 650.0000 mg | ORAL_TABLET | Freq: Four times a day (QID) | ORAL | 1 refills | Status: DC | PRN

## 2016-05-06 MED ORDER — OXYCODONE-ACETAMINOPHEN 5-325 MG PO TABS: 1.0000 | ORAL_TABLET | Freq: Four times a day (QID) | ORAL | 0 refills | Status: DC | PRN

## 2016-05-06 NOTE — Lactation Note (Signed)
This note was copied from a baby's chart. Lactation Consultation Note Baby has bruising to head. Bili serum drawn this am. Slightly jaundice in appearance. Baby had 7 stools, 2 voids in 30 hrs. Of life. Had 6% weight loss. Baby is BF at intervals w/o aggression. Discussed jaundice, cluster feeding, I&O, and need to wake baby for feedings, alert RN if baby is cont. To miss feedings. Discussed spoon feeding for stimulation and possible supplementing for stimulation w/colostrum. Mom encouraged to feed baby 8-12 times/24 hours and with feeding cues. Mom shown how to use DEBP & how to disassemble, clean, & reassemble parts. Mom knows to pump q3h for 15-20 min. LC hand expressed Rt. Breast w/no colostrum noted. Mom states she has seen colostrum. Mom used her personal DEBP today and saw glistening of colostrum.  Mom has compressible short shaft nipples. Gave shells to wear in bra to assist in everting more.   Mom trying to latch baby in cradle position when [place] entered rm. Assisted in football position. Baby BF w/non-nutritive suckling. Attempted to stimulate sleepy baby.  Reported to RN.   Patient Name: Joyce Chapman Today's Date: 05/06/2016 Reason for consult: Follow-up assessment;Infant weight loss;Hyperbilirubinemia   Maternal Data    Feeding Feeding Type: Breast Fed Length of feed: 5 min  LATCH Score/Interventions Latch: Too sleepy or reluctant, no latch achieved, no sucking elicited. Intervention(s): Skin to skin;Teach feeding cues;Waking techniques Intervention(s): Adjust position;Assist with latch;Breast massage;Breast compression  Audible Swallowing: None Intervention(s): Skin to skin;Hand expression  Type of Nipple: Everted at rest and after stimulation  Comfort (Breast/Nipple): Soft / non-tender  Interventions (Filling): Double electric pump  Hold (Positioning): Assistance needed to correctly position infant at breast and maintain latch. Intervention(s): Breastfeeding basics  reviewed;Support Pillows;Position options;Skin to skin  LATCH Score: 5  Lactation Tools Discussed/Used Tools: Shells;Pump Shell Type: Inverted Breast pump type: Double-Electric Breast Pump Pump Review: Setup, frequency, and cleaning;Milk Storage Initiated by:: Allayne Stack RN IBCLC Date initiated:: 05/06/16   Consult Status Consult Status: Follow-up Date: 05/06/16 Follow-up type: In-patient    Joyce Chapman, Elta Guadeloupe 05/06/2016, 4:02 AM

## 2016-05-06 NOTE — Discharge Instructions (Signed)
No vaginal entry No operation automobiles No heavy lifting

## 2016-05-06 NOTE — Progress Notes (Signed)
Post Partum Day 2 Subjective: no complaints, up ad lib, voiding, tolerating PO and + flatus  Objective: Blood pressure 123/69, pulse 67, temperature 98.3 F (36.8 C), temperature source Oral, resp. rate 18, height 5\' 3"  (1.6 m), weight 162 lb (73.5 kg), last menstrual period 08/01/2015, SpO2 98 %, unknown if currently breastfeeding.  Physical Exam:  General: alert, cooperative and no distress Lochia: appropriate Uterine Fundus: firm Incision: healing well DVT Evaluation: No evidence of DVT seen on physical exam.   Recent Labs  05/04/16 0527 05/05/16 0607  HGB 12.5 10.4*  HCT 35.3* 30.2*    Assessment/Plan: Discharge home   LOS: 2 days   Shareef Eddinger II,Sumit Branham E 05/06/2016, 8:43 AM

## 2016-05-06 NOTE — Progress Notes (Signed)
Discharge education complete, discharge instructions and follow up appointment discussed patient verbalized understanding. Patient will be rooming in with infant.

## 2016-05-06 NOTE — Lactation Note (Signed)
This note was copied from a baby's chart. Lactation Consultation Note  Patient Name: Joyce Chapman [id number] Date: 05/06/2016 Reason for consult: Follow-up assessment Follow up visit made.  Mom is currently nursing baby using football hold.  Baby needs good stimulation to nurse actively.  Mom using good breast massage during feeding.  When baby came off I assisted mom with latching baby to left breast using football hold.  Baby opens wide and latched after a few attempts.  Baby very sleepy on this breast.  Discussed cluster feeding. Mom has pumped a few times and obtained drops.  Recommended she continue to post pump to establish milk supply.  Encouraged to call for assist/concerns prn.  Maternal Data    Feeding Feeding Type: Breast Fed Length of feed: 15 min  LATCH Score/Interventions Latch: Grasps breast easily, tongue down, lips flanged, rhythmical sucking. Intervention(s): Skin to skin;Teach feeding cues;Waking techniques Intervention(s): Adjust position;Assist with latch;Breast massage;Breast compression  Audible Swallowing: A few with stimulation  Type of Nipple: Everted at rest and after stimulation  Comfort (Breast/Nipple): Soft / non-tender     Hold (Positioning): Assistance needed to correctly position infant at breast and maintain latch. Intervention(s): Breastfeeding basics reviewed;Support Pillows;Position options;Skin to skin  LATCH Score: 8  Lactation Tools Discussed/Used     Consult Status Consult Status: Follow-up Date: 05/07/16 Follow-up type: In-patient    Ave Filter 05/06/2016, 3:47 PM

## 2016-05-06 NOTE — Discharge Summary (Signed)
Obstetric Discharge Summary Reason for Admission: onset of labor Prenatal Procedures: none Intrapartum Procedures: spontaneous vaginal delivery Postpartum Procedures: none Complications-Operative and Postpartum: none Hemoglobin  Date Value Ref Range Status  05/05/2016 10.4 (L) 12.0 - 15.0 g/dL Final   HCT  Date Value Ref Range Status  05/05/2016 30.2 (L) 36.0 - 46.0 % Final    Physical Exam:  General: alert, cooperative and no distress Lochia: appropriate Uterine Fundus: firm Incision: healing well DVT Evaluation: No evidence of DVT seen on physical exam.  Discharge Diagnoses: Term Pregnancy-delivered  Discharge Information: Date: 05/06/2016 Activity: pelvic rest Diet: routine Medications: PNV, Ibuprofen and Percocet Condition: stable Instructions: refer to practice specific booklet Discharge to: home   Newborn Data: Live born female  Birth Weight: 7 lb 6.8 oz (3368 g) APGAR: 9, 8  Home with mother.  Lliam Hoh II,Oscar Hank E 05/06/2016, 8:50 AM

## 2016-05-07 ENCOUNTER — Ambulatory Visit: Payer: Self-pay

## 2016-05-07 NOTE — Lactation Note (Signed)
This note was copied from a baby's chart. Lactation Consultation Note  Patient Name: Joyce Chapman [id number] Date: 05/07/2016 Reason for consult: Follow-up assessment;Hyperbilirubinemia;Infant weight loss Baby will be discharged today.  F/U Bili check tomorrow and pedi check 05/09/16.  Instructed to give up to 30 mls of formula every 3 hours.  Mom and FOB independently used 5 french feeding tube/syringe at breast.  Mom will continue post pumping.  Instructed to use a bottle for supplement if feeding tube becomes difficult.  Offered and OP appointment and mom will call if she desires more help.  Maternal Data    Feeding Feeding Type: Formula Length of feed: 20 min  LATCH Score/Interventions Latch: Grasps breast easily, tongue down, lips flanged, rhythmical sucking. Intervention(s): Skin to skin Intervention(s): Adjust position;Assist with latch;Breast massage;Breast compression  Audible Swallowing: Spontaneous and intermittent (with supplement at breast)  Type of Nipple: Everted at rest and after stimulation  Comfort (Breast/Nipple): Soft / non-tender     Hold (Positioning): No assistance needed to correctly position infant at breast. Intervention(s): Breastfeeding basics reviewed;Support Pillows;Skin to skin  LATCH Score: 10  Lactation Tools Discussed/Used Tools: 57F feeding tube / Syringe   Consult Status      Ave Filter 05/07/2016, 1:37 PM

## 2016-05-07 NOTE — Lactation Note (Signed)
This note was copied from a baby's chart. Lactation Consultation Note  Patient Name: Joyce Chapman [id number] Date: 05/07/2016 Reason for consult: Follow-up assessment;Hyperbilirubinemia;Infant weight loss Follow up visit made this AM.  Discussed baby's weight loss and bilirubin rising.  Baby cluster fed last night but she also has marks on her chin from pacifier use. Baby voiding well but stools are infrequent.  Recommended supplementing at breast with formula.  Mom just pumped but no milk obtained.  Mom is agreeable to supplementation.  Assisted with latching baby using a 5 french feeding tube and syringe at breast.  Baby latched easily and well and took 12 mls of alimentum.  Mom instructed to breastfeed with cues and supplement with 12-24 mls at breast every 3 hours.  Post pump every 3 hours for 15 minutes.  My phone number left with mom for assist with next feeding.  Report given to [facility] RN.  Maternal Data    Feeding Feeding Type: Formula Length of feed: 20 min  LATCH Score/Interventions Latch: Grasps breast easily, tongue down, lips flanged, rhythmical sucking. Intervention(s): Skin to skin Intervention(s): Adjust position;Assist with latch;Breast massage;Breast compression  Audible Swallowing: Spontaneous and intermittent (with supplement at breast)  Type of Nipple: Everted at rest and after stimulation  Comfort (Breast/Nipple): Soft / non-tender     Hold (Positioning): No assistance needed to correctly position infant at breast. Intervention(s): Breastfeeding basics reviewed;Support Pillows;Skin to skin  LATCH Score: 10  Lactation Tools Discussed/Used Tools: 37F feeding tube / Syringe   Consult Status      Ave Filter 05/07/2016, 10:58 AM

## 2016-08-22 DIAGNOSIS — E221 Hyperprolactinemia: Secondary | ICD-10-CM | POA: Diagnosis not present

## 2016-08-22 DIAGNOSIS — R6889 Other general symptoms and signs: Secondary | ICD-10-CM | POA: Diagnosis not present

## 2016-08-22 DIAGNOSIS — R42 Dizziness and giddiness: Secondary | ICD-10-CM | POA: Diagnosis not present

## 2016-08-22 DIAGNOSIS — D352 Benign neoplasm of pituitary gland: Secondary | ICD-10-CM | POA: Diagnosis not present

## 2016-09-26 DIAGNOSIS — E221 Hyperprolactinemia: Secondary | ICD-10-CM | POA: Diagnosis not present

## 2016-12-01 DIAGNOSIS — D352 Benign neoplasm of pituitary gland: Secondary | ICD-10-CM | POA: Diagnosis not present

## 2016-12-01 DIAGNOSIS — E221 Hyperprolactinemia: Secondary | ICD-10-CM | POA: Diagnosis not present

## 2017-01-16 DIAGNOSIS — E221 Hyperprolactinemia: Secondary | ICD-10-CM | POA: Diagnosis not present

## 2017-01-16 DIAGNOSIS — D352 Benign neoplasm of pituitary gland: Secondary | ICD-10-CM | POA: Diagnosis not present

## 2017-04-06 DIAGNOSIS — D352 Benign neoplasm of pituitary gland: Secondary | ICD-10-CM | POA: Diagnosis not present

## 2017-04-06 DIAGNOSIS — E221 Hyperprolactinemia: Secondary | ICD-10-CM | POA: Diagnosis not present

## 2017-04-18 NOTE — L&D Delivery Note (Signed)
Delivery Note At 1:01 PM a viable female was delivered via Vaginal, Spontaneous (Presentation: LOA).  APGAR: 8, 9; weight pending.   Placenta status: S, I. 3V Cord with the following complications: none.  Cord pH: n/a  Anesthesia:  CLEA Episiotomy: None Lacerations: 2nd degree;Perineal Suture Repair: 3.0 vicryl rapide Est. Blood Loss (mL):  250  Mom to postpartum.  Baby to Couplet care / Skin to Skin.  Steele Ledonne, [place] 03/08/2018, 1:21 PM

## 2017-06-05 DIAGNOSIS — E221 Hyperprolactinemia: Secondary | ICD-10-CM | POA: Diagnosis not present

## 2017-06-06 DIAGNOSIS — Z01419 Encounter for gynecological examination (general) (routine) without abnormal findings: Secondary | ICD-10-CM | POA: Diagnosis not present

## 2017-06-06 DIAGNOSIS — Z6824 Body mass index (BMI) 24.0-24.9, adult: Secondary | ICD-10-CM | POA: Diagnosis not present

## 2017-07-26 ENCOUNTER — Ambulatory Visit: Payer: 59 | Admitting: Physician Assistant

## 2017-07-26 ENCOUNTER — Encounter: Payer: Self-pay | Admitting: Physician Assistant

## 2017-07-26 ENCOUNTER — Encounter: Payer: Self-pay | Admitting: Gastroenterology

## 2017-07-26 VITALS — BP 110/60 | HR 81 | Temp 97.4°F | Resp 18 | Ht 63.0 in | Wt 141.6 lb

## 2017-07-26 DIAGNOSIS — R945 Abnormal results of liver function studies: Secondary | ICD-10-CM | POA: Diagnosis not present

## 2017-07-26 DIAGNOSIS — Z130 Encounter for screening for diseases of the blood and blood-forming organs and certain disorders involving the immune mechanism: Secondary | ICD-10-CM

## 2017-07-26 DIAGNOSIS — K5904 Chronic idiopathic constipation: Secondary | ICD-10-CM

## 2017-07-26 DIAGNOSIS — R7989 Other specified abnormal findings of blood chemistry: Secondary | ICD-10-CM

## 2017-07-26 NOTE — Progress Notes (Signed)
Subjective:    Patient ID: Joaquin Bend, female    DOB: [birth year], 32 y.o.   MRN: [id number]  HPI 32 y.o. WF presents with constipation issues. Has had for years, worse since birth of her girl, 48 months now, [place]. She has BM once every 2 weeks, very hard, small pellets, with bright red blood on TP with wiping, has had 1-2 times in the toilet. She has constant bloating, occ worse AB pain with BM's.  She has felt very fatigued, some nausea, no vomiting. She did have hemorrhoids with pregnancy. She is on stool softener, she drinks 1-2 bottles of water a day. Not on fiber. Does not eat much dairy, no food makes it better or worse.   When she was in high school saw GI, had what sounds like a barium swallow/CT AB- never did colonoscopy/EGD. Paternal grandmother with colon cancer when older, no IBD history.   She has prolactin secreting pituitary tumor, following with Dr. [facility], on medication for this.She is no longer breast feeding due to go back on the medications.  No weight loss, night sweats.  Wt Readings from Last 3 Encounters:  07/26/17 141 lb 9.6 oz (64.2 kg)  05/04/16 162 lb (73.5 kg)  05/03/16 163 lb 4 oz (74 kg)    Blood pressure 110/60, pulse 81, temperature (!) 97.4 F (36.3 C), resp. rate 18, height 5\' 3"  (1.6 m), weight 141 lb 9.6 oz (64.2 kg), last menstrual period 06/02/2017, SpO2 99 %, unknown if currently breastfeeding.  Medications Current Outpatient Medications on File Prior to Visit  Medication Sig  . cabergoline (DOSTINEX) 0.5 MG tablet TK 1 T PO TWICE A WK   No current facility-administered medications on file prior to visit.     Problem list She has Indication for care in labor or delivery; SVD (spontaneous vaginal delivery); Fatigue; Hyperprolactinemia ([place]); and Pituitary adenoma ([place]) on their problem list.   Review of Systems  Constitutional: Positive for fatigue.  HENT: Negative.   Respiratory: Negative.   Cardiovascular: Negative.    Gastrointestinal: Positive for abdominal pain, anal bleeding and constipation.  Genitourinary: Negative.   Musculoskeletal: Negative.   Skin: Negative.   Neurological: Positive for headaches. Negative for dizziness, tremors, seizures, syncope, facial asymmetry, speech difficulty, weakness, light-headedness and numbness.  Hematological: Negative.   Psychiatric/Behavioral: Negative.        Objective:   Physical Exam  Constitutional: She is oriented to person, place, and time. She appears well-developed and well-nourished.  HENT:  Head: Normocephalic and atraumatic.  Right Ear: External ear normal.  Left Ear: External ear normal.  Mouth/Throat: Oropharynx is clear and moist.  Eyes: Pupils are equal, round, and reactive to light. Conjunctivae and EOM are normal.  Neck: Normal range of motion. Neck supple. No thyromegaly present.  Cardiovascular: Normal rate, regular rhythm and normal heart sounds. Exam reveals no gallop and no friction rub.  No murmur heard. Pulmonary/Chest: Effort normal and breath sounds normal. No respiratory distress. She has no wheezes.  Abdominal: Soft. Bowel sounds are normal. She exhibits no distension and no mass. There is tenderness (diffuse). There is no rebound and no guarding.  Genitourinary: Rectal exam shows tenderness. Rectal exam shows no external hemorrhoid (external skin tag), no internal hemorrhoid, no fissure, no mass, anal tone normal and guaiac negative stool (negative).  Musculoskeletal: Normal range of motion.  Lymphadenopathy:    She has no cervical adenopathy.  Neurological: She is alert and oriented to person, place, and time. She displays  normal reflexes. No cranial nerve deficit. Coordination normal.  Skin: Skin is warm and dry.  Psychiatric: She has a normal mood and affect.       Assessment & Plan:    Chronic idiopathic constipation Blood in stool, will refer to GI - will start on linzess samples 156mcg- will call with  results Check labs -     Ambulatory referral to Gastroenterology -     CBC with Differential/Platelet -     BASIC METABOLIC PANEL WITH GFR -     Hepatic function panel -     Celiac Disease Comprehensive Panel with Reflexes  Screening, anemia, deficiency, iron -     Iron,Total/Total Iron Binding Cap -     Ferritin

## 2017-07-26 NOTE — Patient Instructions (Signed)
Being dehydrated can hurt your kidneys, cause fatigue, headaches, muscle aches, joint pain, and dry skin/nails so please increase your fluids.   Drink AT LEAST  oz a day of water, measure it out!  Increase fiber  Can try apple cider vinegar 1-2 tsp in the morning with water or tea  Continue stool softener.   About Constipation  Constipation Overview Constipation is the most common gastrointestinal complaint - about 4 million Americans experience constipation and make 2.5 million physician visits a year to get help for the problem.  Constipation can occur when the colon absorbs too much water, the colon's muscle contraction is slow or sluggish, and/or there is delayed transit time through the colon.  The result is stool that is hard and dry.  Indicators of constipation include straining during bowel movements greater than 25% of the time, having fewer than three bowel movements per week, and/or the feeling of incomplete evacuation.  There are established guidelines ([place] II ) for defining constipation. A person needs to have two or more of the following symptoms for at least 12 weeks (not necessarily consecutive) in the preceding 12 months: . Straining in  greater than 25% of bowel movements . Lumpy or hard stools in greater than 25% of bowel movements . Sensation of incomplete emptying in greater than 25% of bowel movements . Sensation of anorectal obstruction/blockade in greater than 25% of bowel movements . Manual maneuvers to help empty greater than 25% of bowel movements (e.g., digital evacuation, support of the pelvic floor)  . Less than  3 bowel movements/week . Loose stools are not present, and criteria for irritable bowel syndrome are insufficient  Common Causes of Constipation . Lack of fiber in your diet . Lack of physical activity . Medications, including iron and calcium supplements  . Dairy intake . Dehydration . Abuse of laxatives  Travel  Irritable Bowel  Syndrome  Pregnancy  Luteal phase of menstruation (after ovulation and before menses)  Colorectal problems  Intestinal Dysfunction  Treating Constipation  There are several ways of treating constipation, including changes to diet and exercise, use of laxatives, adjustments to the pelvic floor, and scheduled toileting.  These treatments include: . increasing fiber and fluids in the diet  . increasing physical activity . learning muscle coordination   learning proper toileting techniques and toileting modifications   designing and sticking  to a toileting schedule     2007, [employer] Doc.22

## 2017-07-27 ENCOUNTER — Other Ambulatory Visit: Payer: 59

## 2017-07-27 ENCOUNTER — Encounter: Payer: Self-pay | Admitting: Physician Assistant

## 2017-07-27 DIAGNOSIS — R945 Abnormal results of liver function studies: Secondary | ICD-10-CM | POA: Diagnosis not present

## 2017-07-27 DIAGNOSIS — R7989 Other specified abnormal findings of blood chemistry: Secondary | ICD-10-CM

## 2017-07-27 LAB — CBC WITH DIFFERENTIAL/PLATELET
Basophils Absolute: 38 cells/uL (ref 0–200)
Basophils Relative: 0.6 %
Eosinophils Absolute: 38 cells/uL (ref 15–500)
Eosinophils Relative: 0.6 %
HCT: 31.8 % — ABNORMAL LOW (ref 35.0–45.0)
Hemoglobin: 10.8 g/dL — ABNORMAL LOW (ref 11.7–15.5)
Lymphs Abs: 3482 cells/uL (ref 850–3900)
MCH: 29.8 pg (ref 27.0–33.0)
MCHC: 34 g/dL (ref 32.0–36.0)
MCV: 87.6 fL (ref 80.0–100.0)
MPV: 10.2 fL (ref 7.5–12.5)
Monocytes Relative: 11.1 %
NEUTROS PCT: 33.3 %
Neutro Abs: 2131 cells/uL (ref 1500–7800)
PLATELETS: 164 10*3/uL (ref 140–400)
RBC: 3.63 10*6/uL — AB (ref 3.80–5.10)
RDW: 12.9 % (ref 11.0–15.0)
TOTAL LYMPHOCYTE: 54.4 %
WBC mixed population: 710 cells/uL (ref 200–950)
WBC: 6.4 10*3/uL (ref 3.8–10.8)

## 2017-07-27 LAB — IRON, TOTAL/TOTAL IRON BINDING CAP
%SAT: 18 % (calc) (ref 11–50)
IRON: 54 ug/dL (ref 40–190)
TIBC: 299 mcg/dL (calc) (ref 250–450)

## 2017-07-27 LAB — BASIC METABOLIC PANEL WITH GFR
BUN: 8 mg/dL (ref 7–25)
CALCIUM: 8.8 mg/dL (ref 8.6–10.2)
CHLORIDE: 106 mmol/L (ref 98–110)
CO2: 28 mmol/L (ref 20–32)
Creat: 0.55 mg/dL (ref 0.50–1.10)
GFR, EST AFRICAN AMERICAN: 143 mL/min/{1.73_m2} (ref >=60)
GFR, Est Non African American: 123 mL/min/{1.73_m2} (ref >=60)
GLUCOSE: 71 mg/dL (ref 65–99)
POTASSIUM: 3.8 mmol/L (ref 3.5–5.3)
Sodium: 140 mmol/L (ref 135–146)

## 2017-07-27 LAB — CELIAC DISEASE COMPREHENSIVE PANEL WITH REFLEXES
(TTG) AB, IGA: 1 U/mL
IMMUNOGLOBULIN A: 146 mg/dL (ref 81–463)

## 2017-07-27 LAB — HEPATIC FUNCTION PANEL
AG Ratio: 1.8 (calc) (ref 1.0–2.5)
ALBUMIN MSPROF: 4.2 g/dL (ref 3.6–5.1)
ALT: 158 U/L — ABNORMAL HIGH (ref 6–29)
AST: 83 U/L — AB (ref 10–30)
Alkaline phosphatase (APISO): 94 U/L (ref 33–115)
BILIRUBIN DIRECT: 0.1 mg/dL (ref 0.0–0.2)
Globulin: 2.3 g/dL (calc) (ref 1.9–3.7)
Indirect Bilirubin: 0.3 mg/dL (calc) (ref 0.2–1.2)
TOTAL PROTEIN: 6.5 g/dL (ref 6.1–8.1)
Total Bilirubin: 0.4 mg/dL (ref 0.2–1.2)

## 2017-07-27 LAB — FERRITIN: FERRITIN: 265 ng/mL — AB (ref 10–154)

## 2017-07-27 NOTE — Addendum Note (Signed)
Addended by: Vicie Mutters R on: 07/27/2017 07:28 AM   Modules accepted: Orders

## 2017-08-01 ENCOUNTER — Other Ambulatory Visit: Payer: Self-pay | Admitting: Physician Assistant

## 2017-08-01 DIAGNOSIS — R945 Abnormal results of liver function studies: Principal | ICD-10-CM

## 2017-08-01 DIAGNOSIS — R7989 Other specified abnormal findings of blood chemistry: Secondary | ICD-10-CM

## 2017-08-02 ENCOUNTER — Other Ambulatory Visit: Payer: 59

## 2017-08-02 DIAGNOSIS — R945 Abnormal results of liver function studies: Principal | ICD-10-CM

## 2017-08-02 DIAGNOSIS — R7989 Other specified abnormal findings of blood chemistry: Secondary | ICD-10-CM

## 2017-08-02 LAB — HEPATITIS C ANTIBODY
Hepatitis C Ab: NONREACTIVE
SIGNAL TO CUT-OFF: 0.01 (ref <1.00)

## 2017-08-02 LAB — ANA: ANA: POSITIVE — AB

## 2017-08-02 LAB — MITOCHONDRIAL ANTIBODIES: Mitochondrial M2 Ab, IgG: <=20 U

## 2017-08-02 LAB — HEMOCHROMATOSIS DNA-PCR(C282Y,H63D)

## 2017-08-02 LAB — ANTI-NUCLEAR AB-TITER (ANA TITER): ANA Titer 1: 1:80 {titer} — ABNORMAL HIGH

## 2017-08-02 LAB — ANTI-SMOOTH MUSCLE ANTIBODY, IGG: Actin (Smooth Muscle) Antibody (IGG): <20 U (ref <20)

## 2017-08-02 LAB — ANTI-DNA ANTIBODY, DOUBLE-STRANDED: DS DNA AB: 1 [IU]/mL

## 2017-08-03 LAB — HEPATIC FUNCTION PANEL
AG Ratio: 2 (calc) (ref 1.0–2.5)
ALKALINE PHOSPHATASE (APISO): 78 U/L (ref 33–115)
ALT: 86 U/L — ABNORMAL HIGH (ref 6–29)
AST: 39 U/L — ABNORMAL HIGH (ref 10–30)
Albumin: 4.2 g/dL (ref 3.6–5.1)
BILIRUBIN INDIRECT: 0.3 mg/dL (ref 0.2–1.2)
Bilirubin, Direct: 0.1 mg/dL (ref 0.0–0.2)
GLOBULIN: 2.1 g/dL (ref 1.9–3.7)
Total Bilirubin: 0.4 mg/dL (ref 0.2–1.2)
Total Protein: 6.3 g/dL (ref 6.1–8.1)

## 2017-08-07 DIAGNOSIS — N911 Secondary amenorrhea: Secondary | ICD-10-CM | POA: Diagnosis not present

## 2017-08-08 ENCOUNTER — Encounter: Payer: Self-pay | Admitting: Physician Assistant

## 2017-08-21 DIAGNOSIS — Z13228 Encounter for screening for other metabolic disorders: Secondary | ICD-10-CM | POA: Diagnosis not present

## 2017-08-21 DIAGNOSIS — Z3481 Encounter for supervision of other normal pregnancy, first trimester: Secondary | ICD-10-CM | POA: Diagnosis not present

## 2017-08-21 DIAGNOSIS — Z3685 Encounter for antenatal screening for Streptococcus B: Secondary | ICD-10-CM | POA: Diagnosis not present

## 2017-08-21 LAB — OB RESULTS CONSOLE GC/CHLAMYDIA
CHLAMYDIA, DNA PROBE: NEGATIVE
Gonorrhea: NEGATIVE

## 2017-08-21 LAB — OB RESULTS CONSOLE ANTIBODY SCREEN: Antibody Screen: NEGATIVE

## 2017-08-21 LAB — OB RESULTS CONSOLE HEPATITIS B SURFACE ANTIGEN: HEP B S AG: NEGATIVE

## 2017-08-21 LAB — OB RESULTS CONSOLE ABO/RH: RH Type: POSITIVE

## 2017-08-21 LAB — OB RESULTS CONSOLE HIV ANTIBODY (ROUTINE TESTING): HIV: NONREACTIVE

## 2017-08-21 LAB — OB RESULTS CONSOLE RPR: RPR: NONREACTIVE

## 2017-08-21 LAB — OB RESULTS CONSOLE RUBELLA ANTIBODY, IGM: RUBELLA: IMMUNE

## 2017-08-23 ENCOUNTER — Other Ambulatory Visit: Payer: 59

## 2017-08-30 DIAGNOSIS — Z3A12 12 weeks gestation of pregnancy: Secondary | ICD-10-CM | POA: Diagnosis not present

## 2017-09-12 ENCOUNTER — Ambulatory Visit: Payer: 59 | Admitting: Gastroenterology

## 2017-10-16 DIAGNOSIS — D352 Benign neoplasm of pituitary gland: Secondary | ICD-10-CM | POA: Diagnosis not present

## 2017-10-16 DIAGNOSIS — Z3480 Encounter for supervision of other normal pregnancy, unspecified trimester: Secondary | ICD-10-CM | POA: Diagnosis not present

## 2017-10-16 DIAGNOSIS — Z3A18 18 weeks gestation of pregnancy: Secondary | ICD-10-CM | POA: Diagnosis not present

## 2017-10-26 DIAGNOSIS — Z3A20 20 weeks gestation of pregnancy: Secondary | ICD-10-CM | POA: Diagnosis not present

## 2017-10-26 DIAGNOSIS — O26892 Other specified pregnancy related conditions, second trimester: Secondary | ICD-10-CM | POA: Diagnosis not present

## 2017-10-26 DIAGNOSIS — R1031 Right lower quadrant pain: Secondary | ICD-10-CM | POA: Diagnosis not present

## 2017-11-09 DIAGNOSIS — E221 Hyperprolactinemia: Secondary | ICD-10-CM | POA: Diagnosis not present

## 2017-11-09 DIAGNOSIS — D352 Benign neoplasm of pituitary gland: Secondary | ICD-10-CM | POA: Diagnosis not present

## 2017-12-08 ENCOUNTER — Ambulatory Visit: Payer: 59 | Admitting: Internal Medicine

## 2017-12-08 VITALS — BP 102/58 | HR 88 | Temp 97.5°F | Resp 16 | Ht 63.0 in | Wt 158.0 lb

## 2017-12-08 DIAGNOSIS — Z3A28 28 weeks gestation of pregnancy: Secondary | ICD-10-CM

## 2017-12-08 DIAGNOSIS — J40 Bronchitis, not specified as acute or chronic: Secondary | ICD-10-CM | POA: Diagnosis not present

## 2017-12-08 DIAGNOSIS — J0140 Acute pansinusitis, unspecified: Secondary | ICD-10-CM

## 2017-12-08 MED ORDER — AZITHROMYCIN 250 MG PO TABS: ORAL_TABLET | ORAL | 2 refills | Status: DC

## 2017-12-08 NOTE — Progress Notes (Signed)
   Subjective:    Patient ID: Joyce Chapman, female    DOB: [birth year], 32 y.o.   MRN: [id number]  HPI   This delightful 32 yo MWF in 2sd pregnancy (28 weeks gravid) presents with sinus congestion, chest  congestion & productive cough for 10 days with no improvement. Denies fevers, chills, rash or dyspnea.  Outpatient Medications Prior to Visit  Medication Sig Dispense Refill  . cabergoline (DOSTINEX) 0.5 MG tablet TK 1 T PO TWICE A WK  0   No facility-administered medications prior to visit.    Allergies  Allergen Reactions  . Hydrocodone Itching  . Sudafed [Pseudoephedrine Hcl] Palpitations   Past Medical History:  Diagnosis Date  . Prolactinoma ([place])    Review of Systems   10 point systems review negative except as above.    Objective:   Physical Exam  BP (!) 102/58   Pulse 88   Temp (!) 97.5 F (36.4 C)   Resp 16   Ht 5\' 3"  (1.6 m)   Wt 158 lb (71.7 kg)   BMI 27.99 kg/m   No stridor. Congested cough.   HEENT - EAC's/TM's -Nl. Tender fronto-maxillary areas. N/O/P clear. Neck - supple.  Chest - Bilat coarse rales /rhonchi w/o wheezes. Cor - Nl HS. RRR w/o sig m PP 1(+). No edema. MS- FROM w/o deformities.  Gait Nl. Neuro -  Nl w/o focal abnormalities. Skin - exposed - clear w/o rash/cyanosis/icterus    Assessment & Plan:   1. Bronchitis  2. Acute pansinusitis, recurrence not specified  - azithromycin (ZITHROMAX) 250 MG tablet; Take 2 tablets (500 mg) on  Day 1,  followed by 1 tablet (250 mg) once daily on Days 2 through 5.  Dispense: 6 each; Refill: 2  - Allowed Mucinex by OB Dr.

## 2017-12-10 ENCOUNTER — Encounter: Payer: Self-pay | Admitting: Internal Medicine

## 2017-12-19 DIAGNOSIS — Z23 Encounter for immunization: Secondary | ICD-10-CM | POA: Diagnosis not present

## 2017-12-19 DIAGNOSIS — O99283 Endocrine, nutritional and metabolic diseases complicating pregnancy, third trimester: Secondary | ICD-10-CM | POA: Diagnosis not present

## 2017-12-19 DIAGNOSIS — Z3A28 28 weeks gestation of pregnancy: Secondary | ICD-10-CM | POA: Diagnosis not present

## 2017-12-19 DIAGNOSIS — Z3480 Encounter for supervision of other normal pregnancy, unspecified trimester: Secondary | ICD-10-CM | POA: Diagnosis not present

## 2018-01-04 DIAGNOSIS — O99810 Abnormal glucose complicating pregnancy: Secondary | ICD-10-CM | POA: Diagnosis not present

## 2018-01-19 DIAGNOSIS — Z23 Encounter for immunization: Secondary | ICD-10-CM | POA: Diagnosis not present

## 2018-01-19 DIAGNOSIS — Z3A32 32 weeks gestation of pregnancy: Secondary | ICD-10-CM | POA: Diagnosis not present

## 2018-01-19 DIAGNOSIS — O26893 Other specified pregnancy related conditions, third trimester: Secondary | ICD-10-CM | POA: Diagnosis not present

## 2018-02-12 DIAGNOSIS — Z3400 Encounter for supervision of normal first pregnancy, unspecified trimester: Secondary | ICD-10-CM | POA: Diagnosis not present

## 2018-02-15 DIAGNOSIS — Z3685 Encounter for antenatal screening for Streptococcus B: Secondary | ICD-10-CM | POA: Diagnosis not present

## 2018-02-15 LAB — OB RESULTS CONSOLE GBS: GBS: NEGATIVE

## 2018-02-20 DIAGNOSIS — O26893 Other specified pregnancy related conditions, third trimester: Secondary | ICD-10-CM | POA: Diagnosis not present

## 2018-02-20 DIAGNOSIS — Z3A37 37 weeks gestation of pregnancy: Secondary | ICD-10-CM | POA: Diagnosis not present

## 2018-03-01 ENCOUNTER — Encounter (HOSPITAL_COMMUNITY): Payer: Self-pay | Admitting: *Deleted

## 2018-03-01 ENCOUNTER — Telehealth (HOSPITAL_COMMUNITY): Payer: Self-pay | Admitting: *Deleted

## 2018-03-01 NOTE — Telephone Encounter (Signed)
Preadmission screen  

## 2018-03-08 ENCOUNTER — Inpatient Hospital Stay (HOSPITAL_COMMUNITY): Payer: 59 | Admitting: Anesthesiology

## 2018-03-08 ENCOUNTER — Encounter (HOSPITAL_COMMUNITY): Payer: Self-pay

## 2018-03-08 ENCOUNTER — Inpatient Hospital Stay (HOSPITAL_COMMUNITY)
Admission: RE | Admit: 2018-03-08 | Discharge: 2018-03-09 | DRG: 807 | Disposition: A | Discharge status: Home / Self Care | Payer: 59 | Attending: Obstetrics & Gynecology | Admitting: Obstetrics & Gynecology

## 2018-03-08 DIAGNOSIS — A6000 Herpesviral infection of urogenital system, unspecified: Secondary | ICD-10-CM | POA: Diagnosis present

## 2018-03-08 DIAGNOSIS — Z3490 Encounter for supervision of normal pregnancy, unspecified, unspecified trimester: Secondary | ICD-10-CM

## 2018-03-08 DIAGNOSIS — O26893 Other specified pregnancy related conditions, third trimester: Secondary | ICD-10-CM | POA: Diagnosis not present

## 2018-03-08 DIAGNOSIS — Z3A39 39 weeks gestation of pregnancy: Secondary | ICD-10-CM

## 2018-03-08 DIAGNOSIS — Z87891 Personal history of nicotine dependence: Secondary | ICD-10-CM | POA: Diagnosis not present

## 2018-03-08 DIAGNOSIS — D352 Benign neoplasm of pituitary gland: Secondary | ICD-10-CM | POA: Diagnosis present

## 2018-03-08 DIAGNOSIS — O9832 Other infections with a predominantly sexual mode of transmission complicating childbirth: Principal | ICD-10-CM | POA: Diagnosis present

## 2018-03-08 HISTORY — DX: Benign neoplasm of pituitary gland: D35.2

## 2018-03-08 LAB — TYPE AND SCREEN
ABO/RH(D): B POS
ANTIBODY SCREEN: NEGATIVE

## 2018-03-08 LAB — CBC
HCT: 34.9 % — ABNORMAL LOW (ref 36.0–46.0)
Hemoglobin: 11.6 g/dL — ABNORMAL LOW (ref 12.0–15.0)
MCH: 30.1 pg (ref 26.0–34.0)
MCHC: 33.2 g/dL (ref 30.0–36.0)
MCV: 90.6 fL (ref 80.0–100.0)
Platelets: 183 10*3/uL (ref 150–400)
RBC: 3.85 MIL/uL — ABNORMAL LOW (ref 3.87–5.11)
RDW: 13.4 % (ref 11.5–15.5)
WBC: 8.8 10*3/uL (ref 4.0–10.5)
nRBC: 0 % (ref 0.0–0.2)

## 2018-03-08 LAB — RPR: RPR Ser Ql: NONREACTIVE

## 2018-03-08 MED ORDER — FENTANYL CITRATE (PF) 100 MCG/2ML IJ SOLN: 50.0000 ug | INTRAMUSCULAR | Status: DC | PRN

## 2018-03-08 MED ORDER — PHENYLEPHRINE 40 MCG/ML (10ML) SYRINGE FOR IV PUSH (FOR BLOOD PRESSURE SUPPORT)
80.0000 ug | PREFILLED_SYRINGE | INTRAVENOUS | Status: DC | PRN
  Filled 2018-03-08: qty 5

## 2018-03-08 MED ORDER — FENTANYL 2.5 MCG/ML BUPIVACAINE 1/10 % EPIDURAL INFUSION (WH - ANES)
14.0000 mL/h | INTRAMUSCULAR | Status: DC | PRN
  Administered 2018-03-08: 14 mL/h via EPIDURAL
  Filled 2018-03-08: qty 100

## 2018-03-08 MED ORDER — EPHEDRINE 5 MG/ML INJ
10.0000 mg | INTRAVENOUS | Status: DC | PRN
  Filled 2018-03-08: qty 2

## 2018-03-08 MED ORDER — ACETAMINOPHEN 325 MG PO TABS
650.0000 mg | ORAL_TABLET | ORAL | Status: DC | PRN
  Administered 2018-03-08 – 2018-03-09 (×3): 650 mg via ORAL
  Filled 2018-03-08 (×3): qty 2

## 2018-03-08 MED ORDER — IBUPROFEN 600 MG PO TABS
600.0000 mg | ORAL_TABLET | Freq: Four times a day (QID) | ORAL | Status: DC
  Administered 2018-03-08 – 2018-03-09 (×4): 600 mg via ORAL
  Filled 2018-03-08 (×3): qty 1

## 2018-03-08 MED ORDER — ONDANSETRON HCL 4 MG PO TABS: 4.0000 mg | ORAL_TABLET | ORAL | Status: DC | PRN

## 2018-03-08 MED ORDER — ZOLPIDEM TARTRATE 5 MG PO TABS: 5.0000 mg | ORAL_TABLET | Freq: Every evening | ORAL | Status: DC | PRN

## 2018-03-08 MED ORDER — DIPHENHYDRAMINE HCL 50 MG/ML IJ SOLN: 12.5000 mg | INTRAMUSCULAR | Status: DC | PRN

## 2018-03-08 MED ORDER — OXYTOCIN 40 UNITS IN LACTATED RINGERS INFUSION - SIMPLE MED
1.0000 m[IU]/min | INTRAVENOUS | Status: DC
  Administered 2018-03-08: 2 m[IU]/min via INTRAVENOUS

## 2018-03-08 MED ORDER — SENNOSIDES-DOCUSATE SODIUM 8.6-50 MG PO TABS
2.0000 | ORAL_TABLET | ORAL | Status: DC
  Administered 2018-03-09: 2 via ORAL
  Filled 2018-03-08: qty 2

## 2018-03-08 MED ORDER — TERBUTALINE SULFATE 1 MG/ML IJ SOLN
0.2500 mg | Freq: Once | INTRAMUSCULAR | Status: DC | PRN
  Filled 2018-03-08: qty 1

## 2018-03-08 MED ORDER — LACTATED RINGERS IV SOLN: 500.0000 mL | INTRAVENOUS | Status: DC | PRN

## 2018-03-08 MED ORDER — ONDANSETRON HCL 4 MG/2ML IJ SOLN: 4.0000 mg | INTRAMUSCULAR | Status: DC | PRN

## 2018-03-08 MED ORDER — ACETAMINOPHEN 325 MG PO TABS: 650.0000 mg | ORAL_TABLET | ORAL | Status: DC | PRN

## 2018-03-08 MED ORDER — LIDOCAINE HCL (PF) 1 % IJ SOLN
30.0000 mL | INTRAMUSCULAR | Status: DC | PRN
  Filled 2018-03-08: qty 30

## 2018-03-08 MED ORDER — SOD CITRATE-CITRIC ACID 500-334 MG/5ML PO SOLN: 30.0000 mL | ORAL | Status: DC | PRN

## 2018-03-08 MED ORDER — FLEET ENEMA 7-19 GM/118ML RE ENEM: 1.0000 | ENEMA | RECTAL | Status: DC | PRN

## 2018-03-08 MED ORDER — TETANUS-DIPHTH-ACELL PERTUSSIS 5-2.5-18.5 LF-MCG/0.5 IM SUSP: 0.5000 mL | Freq: Once | INTRAMUSCULAR | Status: DC

## 2018-03-08 MED ORDER — OXYTOCIN 40 UNITS IN LACTATED RINGERS INFUSION - SIMPLE MED
2.5000 [IU]/h | INTRAVENOUS | Status: DC
  Filled 2018-03-08: qty 1000

## 2018-03-08 MED ORDER — PRENATAL MULTIVITAMIN CH
1.0000 | ORAL_TABLET | Freq: Every day | ORAL | Status: DC
  Administered 2018-03-09: 1 via ORAL

## 2018-03-08 MED ORDER — PHENYLEPHRINE 40 MCG/ML (10ML) SYRINGE FOR IV PUSH (FOR BLOOD PRESSURE SUPPORT)
80.0000 ug | PREFILLED_SYRINGE | INTRAVENOUS | Status: DC | PRN
  Filled 2018-03-08: qty 5
  Filled 2018-03-08: qty 10

## 2018-03-08 MED ORDER — WITCH HAZEL-GLYCERIN EX PADS
1.0000 "application " | MEDICATED_PAD | CUTANEOUS | Status: DC | PRN
  Administered 2018-03-08: 1 via TOPICAL

## 2018-03-08 MED ORDER — DIPHENHYDRAMINE HCL 25 MG PO CAPS: 25.0000 mg | ORAL_CAPSULE | Freq: Four times a day (QID) | ORAL | Status: DC | PRN

## 2018-03-08 MED ORDER — OXYTOCIN BOLUS FROM INFUSION
500.0000 mL | Freq: Once | INTRAVENOUS | Status: AC
  Administered 2018-03-08: 500 mL via INTRAVENOUS

## 2018-03-08 MED ORDER — BENZOCAINE-MENTHOL 20-0.5 % EX AERO
1.0000 "application " | INHALATION_SPRAY | CUTANEOUS | Status: DC | PRN
  Administered 2018-03-08 – 2018-03-09 (×2): 1 via TOPICAL
  Filled 2018-03-08 (×2): qty 56

## 2018-03-08 MED ORDER — LACTATED RINGERS IV SOLN
INTRAVENOUS | Status: DC
  Administered 2018-03-08 (×2): via INTRAVENOUS

## 2018-03-08 MED ORDER — LIDOCAINE-EPINEPHRINE (PF) 2 %-1:200000 IJ SOLN
INTRAMUSCULAR | Status: DC | PRN
  Administered 2018-03-08: 3 mL via EPIDURAL
  Administered 2018-03-08: 4 mL via EPIDURAL

## 2018-03-08 MED ORDER — DIBUCAINE 1 % RE OINT
1.0000 "application " | TOPICAL_OINTMENT | RECTAL | Status: DC | PRN
  Administered 2018-03-08: 1 via RECTAL
  Filled 2018-03-08: qty 28

## 2018-03-08 MED ORDER — LACTATED RINGERS IV SOLN: 500.0000 mL | Freq: Once | INTRAVENOUS | Status: DC

## 2018-03-08 MED ORDER — SIMETHICONE 80 MG PO CHEW: 80.0000 mg | CHEWABLE_TABLET | ORAL | Status: DC | PRN

## 2018-03-08 MED ORDER — ONDANSETRON HCL 4 MG/2ML IJ SOLN
4.0000 mg | Freq: Four times a day (QID) | INTRAMUSCULAR | Status: DC | PRN
  Administered 2018-03-08: 4 mg via INTRAVENOUS
  Filled 2018-03-08: qty 2

## 2018-03-08 MED ORDER — COCONUT OIL OIL
1.0000 "application " | TOPICAL_OIL | Status: DC | PRN
  Administered 2018-03-09: 1 via TOPICAL
  Filled 2018-03-08: qty 120

## 2018-03-08 NOTE — Anesthesia Procedure Notes (Signed)
Epidural Patient location during procedure: OB Start time: 03/08/2018 10:15 AM End time: 03/08/2018 10:30 AM  Staffing Anesthesiologist: Freddrick March, MD Performed: anesthesiologist   Preanesthetic Checklist Completed: patient identified, pre-op evaluation, timeout performed, IV checked, risks and benefits discussed and monitors and equipment checked  Epidural Patient position: sitting Prep: site prepped and draped and DuraPrep Patient monitoring: continuous pulse ox, blood pressure, heart rate and cardiac monitor Approach: midline Location: L3-L4 Injection technique: LOR air  Needle:  Needle type: Tuohy  Needle gauge: 17 G Needle length: 9 cm Needle insertion depth: 5 cm Catheter type: closed end flexible Catheter size: 19 Gauge Catheter at skin depth: 10 cm Test dose: negative  Assessment Sensory level: T8 Events: blood not aspirated, injection not painful, no injection resistance, negative IV test and no paresthesia  Additional Notes Patient identified. Risks/Benefits/Options discussed with patient including but not limited to bleeding, infection, nerve damage, paralysis, failed block, incomplete pain control, headache, blood pressure changes, nausea, vomiting, reactions to medication both or allergic, itching and postpartum back pain. Confirmed with bedside nurse the patient's most recent platelet count. Confirmed with patient that they are not currently taking any anticoagulation, have any bleeding history or any family history of bleeding disorders. Patient expressed understanding and wished to proceed. All questions were answered. Sterile technique was used throughout the entire procedure. Please see nursing notes for vital signs. Test dose was given through epidural catheter and negative prior to continuing to dose epidural or start infusion. Warning signs of high block given to the patient including shortness of breath, tingling/numbness in hands, complete motor  block, or any concerning symptoms with instructions to call for help. Patient was given instructions on fall risk and not to get out of bed. All questions and concerns addressed with instructions to call with any issues or inadequate analgesia.  Reason for block:procedure for pain

## 2018-03-08 NOTE — Anesthesia Pain Management Evaluation Note (Signed)
  CRNA Pain Management Visit Note  Patient: Joyce Chapman, 32 y.o., female  "Hello I am a member of the anesthesia team at [facility]. We have an anesthesia team available at all times to provide care throughout the hospital, including epidural management and anesthesia for C-section. I don't know your plan for the delivery whether it a natural birth, water birth, IV sedation, nitrous supplementation, doula or epidural, but we want to meet your pain goals."   1.Was your pain managed to your expectations on prior hospitalizations?   Yes   2.What is your expectation for pain management during this hospitalization?     Epidural  3.How can we help you reach that goal? Epidural recently placed and the patient is starting to obtain pain relief.  Record the patient's initial score and the patient's pain goal.   Pain: 8 prior to insertion of the epidural, now 5.  Pain Goal: 2 [facility] wants you to be able to say your pain was always managed very well.  [name] 03/08/2018

## 2018-03-08 NOTE — Lactation Note (Signed)
This note was copied from a baby's chart. Lactation Consultation Note  Patient Name: Joyce Chapman VOHCS'P Date: 03/08/2018 Reason for consult: Initial assessment;Term P2, 10 hour female infant. LC entered room mom asleep at this time.  Maternal Data    Feeding    LATCH Score                   Interventions    Lactation Tools Discussed/Used     Consult Status      Vicente Serene 03/08/2018, 11:37 PM

## 2018-03-08 NOTE — H&P (Signed)
Joyce Chapman is a 32 y.o. female presenting for elective IOL.  Antepartum course complicated by prolactinoma; managed by Dr. Mare Ferrari.  Serial U/S have been performed; last 02/20/18 with EFW 6#6 (53%). She was treated with cabergoline until around 34 weeks when her medication was discontinued to allow lactation.  She has no symptoms from her pituitary lesion.  She has h/o HSV and has been on Valtrex suppression.  She denies prodromal symptoms.  GBS negative.  OB History    Gravida  2   Para  1   Term  1   Preterm      AB      Living  1     SAB      TAB      Ectopic      Multiple  0   Live Births  1          Past Medical History:  Diagnosis Date  . Pituitary adenoma ([place])    DX 2015  . Prolactinoma [facility])    Past Surgical History:  Procedure Laterality Date  . TONSILLECTOMY    . WISDOM TOOTH EXTRACTION     Family History: family history includes Colon cancer in her paternal grandmother and paternal uncle; Diabetes in her maternal grandfather; Heart attack in her father; Heart disease in her father; Melanoma in her mother; Stomach cancer in her paternal grandmother; Throat cancer in her maternal grandfather; Transient ischemic attack in her mother. Social History:  reports that she quit smoking about 3 years ago. Her smoking use included cigarettes. She smoked 0.05 packs per day. She has never used smokeless tobacco. She reports that she drinks alcohol. She reports that she does not use drugs.     Maternal Diabetes: No Genetic Screening: Normal Maternal Ultrasounds/Referrals: Normal Fetal Ultrasounds or other Referrals:  None Maternal Substance Abuse:  No Significant Maternal Medications:  None Significant Maternal Lab Results:  Lab values include: Group B Strep negative Other Comments:  None  ROS Maternal Medical History:  Fetal activity: Perceived fetal activity is normal.   Last perceived fetal movement was within the past hour.    Prenatal complications: no  prenatal complications Prenatal Complications - Diabetes: none.    Dilation: 3.5 Effacement (%): 50 Station: -2 Exam by:: Senita Corredor Blood pressure 114/70, pulse 98, temperature 97.8 F (36.6 C), temperature source Oral, resp. rate 18, height 5\' 3"  (1.6 m), weight 74.6 kg, last menstrual period 06/02/2017, unknown if currently breastfeeding. Maternal Exam:  Uterine Assessment: Contraction strength is mild.  Contraction frequency is rare.   Abdomen: Fundal height is c/w dates.   Estimated fetal weight is 8#.   Fetal presentation: vertex  Introitus: Normal vulva. Vulva is negative for lesion.  Amniotic fluid character: clear.  Pelvis: adequate for delivery.   Cervix: Cervix evaluated by digital exam.     Physical Exam  Constitutional: She is oriented to person, place, and time. She appears well-developed and well-nourished.  GI: Soft. There is no tenderness. There is no rebound and no guarding.  Genitourinary: Vulva exhibits no lesion.  Neurological: She is alert and oriented to person, place, and time.  Skin: Skin is warm and dry.  Psychiatric: She has a normal mood and affect. Her behavior is normal.    Prenatal labs: ABO, Rh: B/Positive/-- (05/06 0000) Antibody: n (05/06 0000) Rubella: Immune (05/06 0000) RPR: Nonreactive (05/06 0000)  HBsAg: Negative (05/06 0000)  HIV: Non-reactive (05/06 0000)  GBS: Negative (10/31 0000)   Assessment/Plan: 31yo G2P1001 at 39  weeks for IOL -AROM clear -Pitocin 2/2 -Planning CLEA -Anticipate NSVD   Chelsey Redondo 03/08/2018, 8:52 AM

## 2018-03-08 NOTE — Anesthesia Preprocedure Evaluation (Signed)
Anesthesia Evaluation  Patient identified by MRN, date of birth, ID band Patient awake    Reviewed: Allergy & Precautions, NPO status , Patient's Chart, lab work & pertinent test results  Airway Mallampati: I  TM Distance: >3 FB Neck ROM: Full    Dental no notable dental hx. (+) Teeth Intact   Pulmonary neg pulmonary ROS, former smoker,    Pulmonary exam normal breath sounds clear to auscultation       Cardiovascular negative cardio ROS Normal cardiovascular exam Rhythm:Regular Rate:Normal     Neuro/Psych Pituitary adenoma, no symptoms, epidural in the past without complication negative neurological ROS  negative psych ROS   GI/Hepatic negative GI ROS, Neg liver ROS,   Endo/Other  negative endocrine ROS  Renal/GU negative Renal ROS  negative genitourinary   Musculoskeletal negative musculoskeletal ROS (+)   Abdominal   Peds  Hematology negative hematology ROS (+)   Anesthesia Other Findings   Reproductive/Obstetrics negative OB ROS (+) Pregnancy                             Anesthesia Physical Anesthesia Plan  ASA: III  Anesthesia Plan: Epidural   Post-op Pain Management:    Induction:   PONV Risk Score and Plan: Treatment may vary due to age or medical condition  Airway Management Planned: Natural Airway  Additional Equipment:   Intra-op Plan:   Post-operative Plan:   Informed Consent: I have reviewed the patients History and Physical, chart, labs and discussed the procedure including the risks, benefits and alternatives for the proposed anesthesia with the patient or authorized representative who has indicated his/her understanding and acceptance.     Plan Discussed with: Anesthesiologist  Anesthesia Plan Comments: (Patient identified. Risks, benefits, options discussed with patient including but not limited to bleeding, infection, nerve damage, paralysis, failed  block, incomplete pain control, headache, blood pressure changes, nausea, vomiting, reactions to medication, itching, and post partum back pain. Confirmed with bedside nurse the patient's most recent platelet count. Confirmed with the patient that they are not taking any anticoagulation, have any bleeding history or any family history of bleeding disorders. Patient expressed understanding and wishes to proceed. All questions were answered. )        Anesthesia Quick Evaluation

## 2018-03-08 NOTE — Anesthesia Postprocedure Evaluation (Signed)
Anesthesia Post Note  Patient: Joyce Chapman  Procedure(s) Performed: AN AD HOC LABOR EPIDURAL     Patient location during evaluation: Mother Baby Anesthesia Type: Epidural Level of consciousness: awake and alert and oriented Pain management: satisfactory to patient Vital Signs Assessment: post-procedure vital signs reviewed and stable Respiratory status: respiratory function stable Cardiovascular status: stable Postop Assessment: no headache, no backache, epidural receding, patient able to bend at knees, no signs of nausea or vomiting and adequate PO intake Anesthetic complications: no    Last Vitals:  Vitals:   03/08/18 1500 03/08/18 1600  BP: 110/67 121/83  Pulse: 61 91  Resp: 16 18  Temp: 36.7 C 36.8 C  SpO2:      Last Pain:  Vitals:   03/08/18 1600  TempSrc: Oral   Pain Goal:                 [name]

## 2018-03-09 LAB — CBC
HCT: 32.1 % — ABNORMAL LOW (ref 36.0–46.0)
Hemoglobin: 10.5 g/dL — ABNORMAL LOW (ref 12.0–15.0)
MCH: 29.7 pg (ref 26.0–34.0)
MCHC: 32.7 g/dL (ref 30.0–36.0)
MCV: 90.9 fL (ref 80.0–100.0)
Platelets: 183 10*3/uL (ref 150–400)
RBC: 3.53 MIL/uL — ABNORMAL LOW (ref 3.87–5.11)
RDW: 13.5 % (ref 11.5–15.5)
WBC: 9.8 10*3/uL (ref 4.0–10.5)
nRBC: 0 % (ref 0.0–0.2)

## 2018-03-09 MED ORDER — TRAMADOL HCL 50 MG PO TABS
50.0000 mg | ORAL_TABLET | Freq: Four times a day (QID) | ORAL | Status: DC | PRN
  Administered 2018-03-09 (×2): 50 mg via ORAL
  Filled 2018-03-09 (×2): qty 1

## 2018-03-09 MED ORDER — IBUPROFEN 600 MG PO TABS: 600.0000 mg | ORAL_TABLET | Freq: Four times a day (QID) | ORAL | 0 refills | Status: DC | PRN

## 2018-03-09 MED ORDER — TRAMADOL HCL 50 MG PO TABS: 50.0000 mg | ORAL_TABLET | Freq: Four times a day (QID) | ORAL | 0 refills | Status: DC | PRN

## 2018-03-09 MED ORDER — ACETAMINOPHEN 325 MG PO TABS: 650.0000 mg | ORAL_TABLET | Freq: Four times a day (QID) | ORAL | 1 refills | Status: DC | PRN

## 2018-03-09 NOTE — Lactation Note (Signed)
This note was copied from a baby's chart. Lactation Consultation Note: Mother reports that breastfeeding is going much better today.  Mother denies having any discomfort with breastfeeding.  Advised to continue to breastfeeding infant 8-12 times in 24 hours Mother advised to page for [facility] as needed to check latch of the next feeding.   Discussed cluster feeding. Advised in treatment and prevention of engorgement. Reviewed all available LC services at [facility]. BFSG'S and outpatients dept services.   Patient Name: Girl Joyce Chapman Today's Date: 03/09/2018 Reason for consult: Follow-up assessment   Maternal Data    Feeding Feeding Type: Breast Fed  LATCH Score                   Interventions    Lactation Tools Discussed/Used     Consult Status Consult Status: Complete    Darla Lesches 03/09/2018, 10:39 AM

## 2018-03-09 NOTE — Discharge Summary (Signed)
Obstetric Discharge Summary Reason for Admission: induction of labor Prenatal Procedures: none Intrapartum Procedures: spontaneous vaginal delivery Postpartum Procedures: none Complications-Operative and Postpartum: none Hemoglobin  Date Value Ref Range Status  03/09/2018 10.5 (L) 12.0 - 15.0 g/dL Final   HCT  Date Value Ref Range Status  03/09/2018 32.1 (L) 36.0 - 46.0 % Final    Physical Exam:  General: alert, cooperative and no distress Lochia: appropriate Uterine Fundus: firm Incision: healing well DVT Evaluation: No evidence of DVT seen on physical exam.  Discharge Diagnoses: Term Pregnancy-delivered  Discharge Information: Date: 03/09/2018 Activity: pelvic rest Diet: routine Medications: PNV, Ibuprofen and tramadol Condition: stable Instructions: refer to practice specific booklet Discharge to: home   Newborn Data: Live born female  Birth Weight: 7 lb 9.2 oz (3435 g) APGAR: 8, 9  Newborn Delivery   Birth date/time:  03/08/2018 13:01:00 Delivery type:  Vaginal, Spontaneous     Home with mother.  Shon Millet II 03/09/2018, 8:08 AM

## 2018-03-09 NOTE — Lactation Note (Signed)
This note was copied from a baby's chart. Lactation Consultation Note  Patient Name: Joyce Chapman Today's Date: 03/09/2018 Reason for consult: Term;Follow-up assessment P2, 66 hour female infant. Per mom, BF for 6 weeks but stopped due meds. she was taking due pituitary tumor. Per mom, infant had 1 stool and 3 wet diapers since delivery. Mom BF 4 times and increased duration 15 to 20 minutes now per feedings. Mom latched infant on left breast using the cross  cradle hold, infant had wide mouth gape and audible swallowing observed by LC infant BF for 15 minutes and still BF as LC left the room.  Mom plans BF as long as she can she is currently off meds. for tumor. Mom will BF according hunger cues and mom will not exceed 3 hours without feeding infant.  LC discussed I & O. Reviewed Baby & Me book's Breastfeeding Basics.  Mom made aware of O/P services, breastfeeding support groups, community resources, and our phone # for post-discharge questions.  Maternal Data Formula Feeding for Exclusion: No Has patient been taught Hand Expression?: Yes(Mom demostrated hand expression to LC colostrum present.) Does the patient have breastfeeding experience prior to this delivery?: Yes  Feeding Feeding Type: Breast Fed  LATCH Score Latch: Grasps breast easily, tongue down, lips flanged, rhythmical sucking.  Audible Swallowing: Spontaneous and intermittent  Type of Nipple: Everted at rest and after stimulation  Comfort (Breast/Nipple): Soft / non-tender  Hold (Positioning): Assistance needed to correctly position infant at breast and maintain latch.  LATCH Score: 9  Interventions Interventions: Breast feeding basics reviewed;Assisted with latch;Adjust position;Support pillows;Hand express;Position options  Lactation Tools Discussed/Used WIC Program: No   Consult Status Consult Status: Follow-up Date: 03/10/18 Follow-up type: In-patient    Vicente Serene 03/09/2018, 5:05  AM

## 2018-03-13 ENCOUNTER — Inpatient Hospital Stay (HOSPITAL_COMMUNITY): Admission: AD | Admit: 2018-03-13 | Payer: 59 | Source: Ambulatory Visit | Admitting: Obstetrics & Gynecology

## 2018-05-15 DIAGNOSIS — D352 Benign neoplasm of pituitary gland: Secondary | ICD-10-CM | POA: Diagnosis not present

## 2018-05-15 DIAGNOSIS — E221 Hyperprolactinemia: Secondary | ICD-10-CM | POA: Diagnosis not present

## 2018-05-17 DIAGNOSIS — Z3009 Encounter for other general counseling and advice on contraception: Secondary | ICD-10-CM | POA: Diagnosis not present

## 2018-05-24 DIAGNOSIS — Z30430 Encounter for insertion of intrauterine contraceptive device: Secondary | ICD-10-CM | POA: Diagnosis not present

## 2018-07-10 ENCOUNTER — Encounter: Payer: Self-pay | Admitting: Internal Medicine

## 2018-07-11 IMAGING — US US MFM OB DETAIL+14 WK
1 series · 14 of 28 positions shown · non-contrast
Comparison: none

[Series 1: us mfm ob detail+14 wk · 14 of 75 slices shown]
[im 3/75]
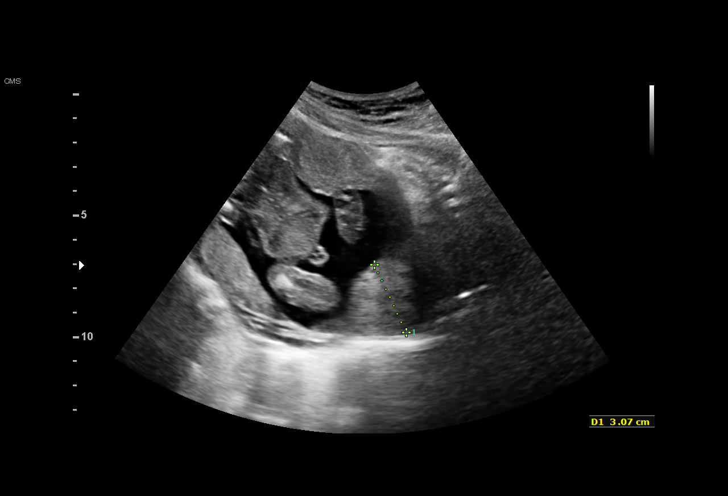
[im 9/75]
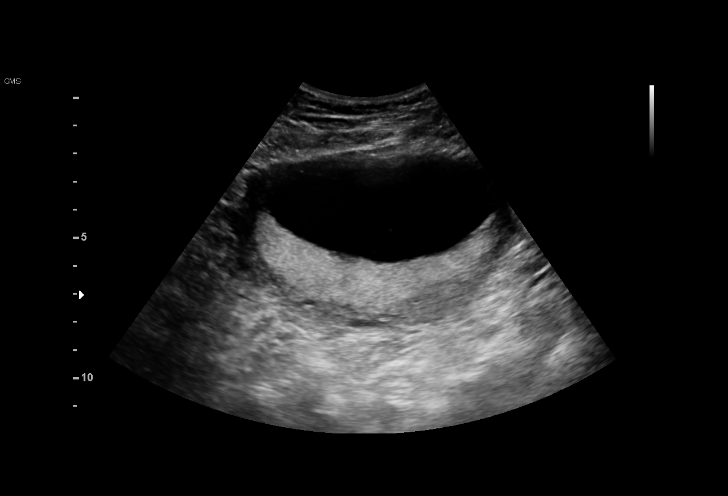
[im 14/75]
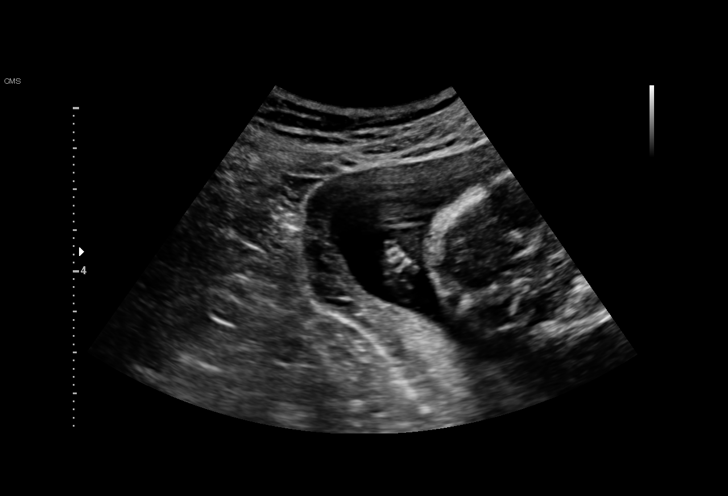
[im 20/75]
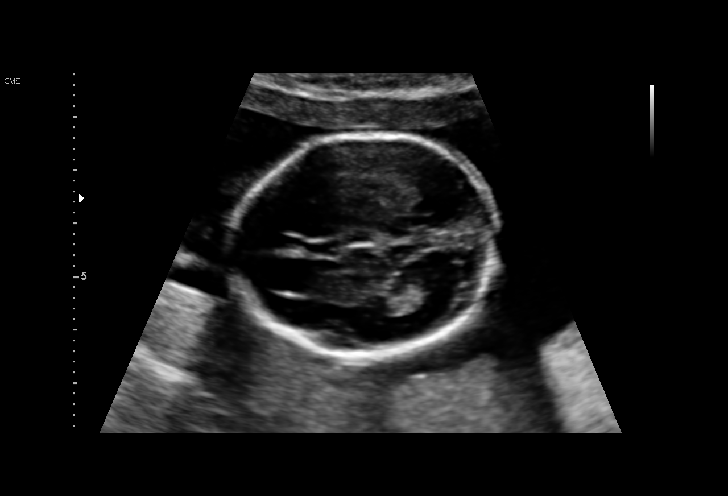
[im 25/75]
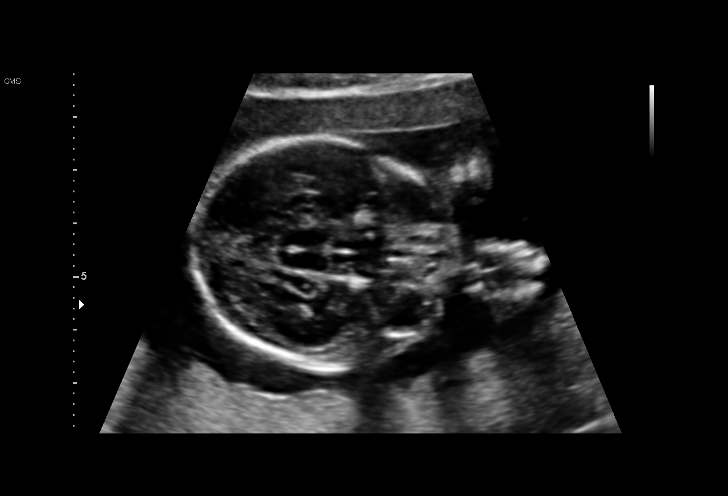
[im 31/75]
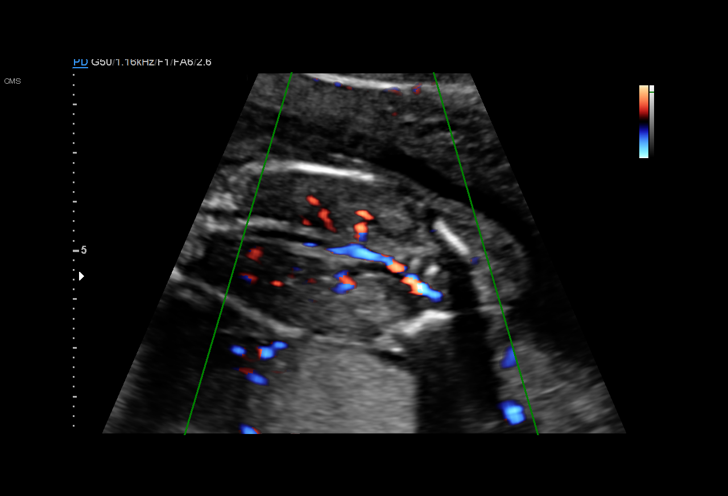
[im 36/75]
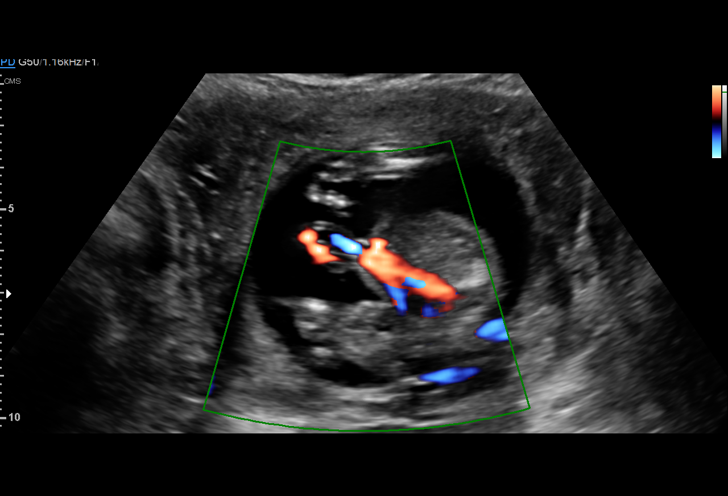
[im 42/75]
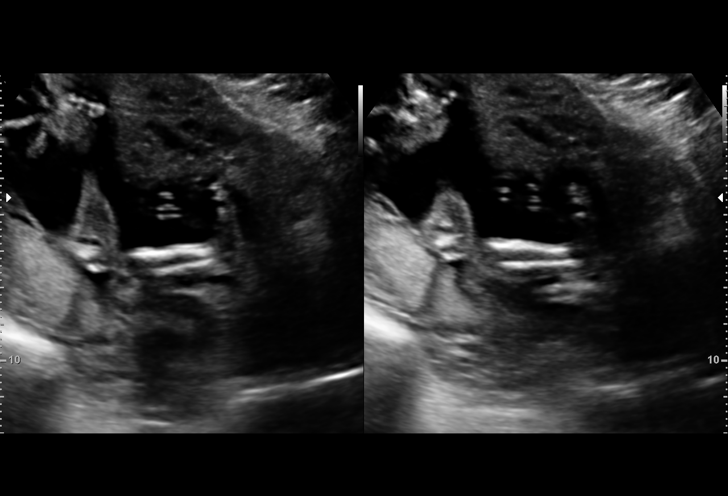
[im 47/75]
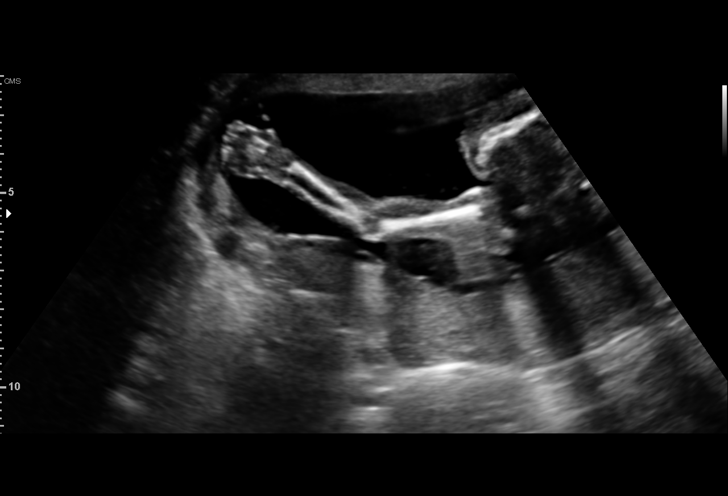
[im 53/75]
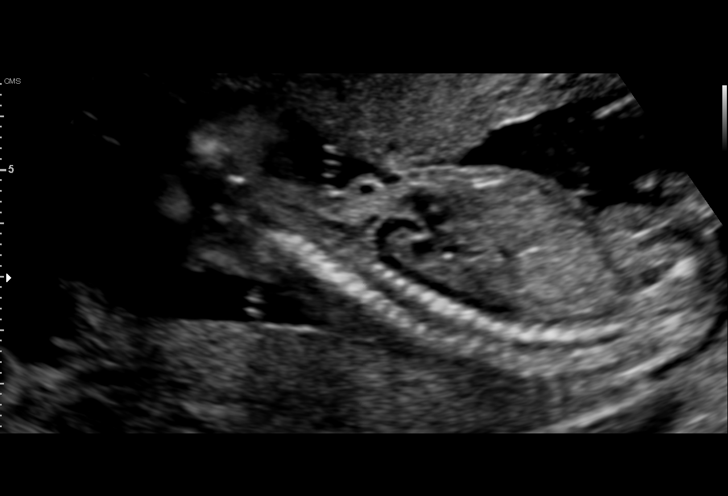
[im 58/75]
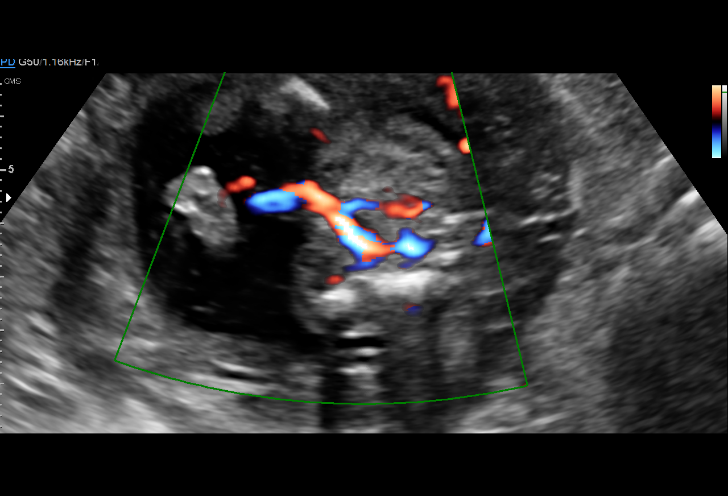
[im 64/75]
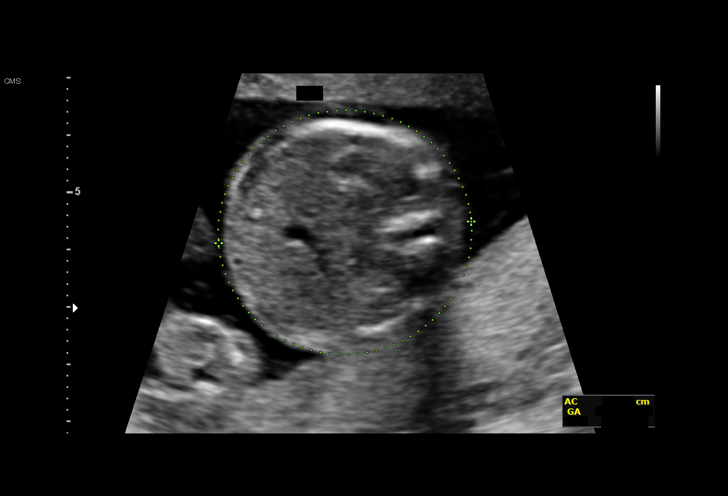
[im 69/75]
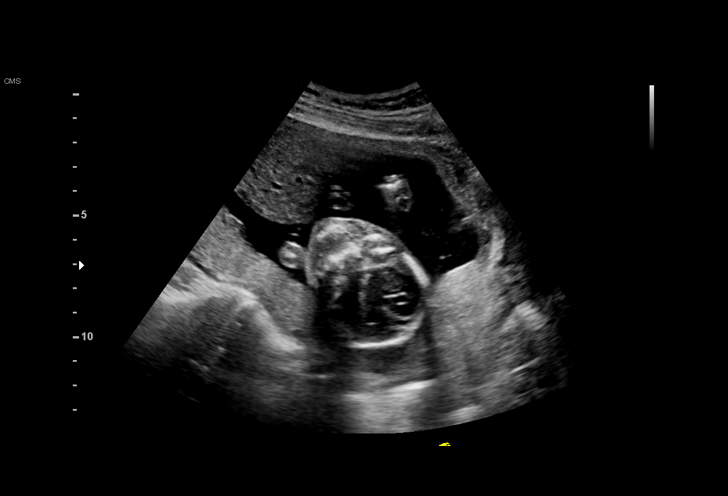
[im 75/75]
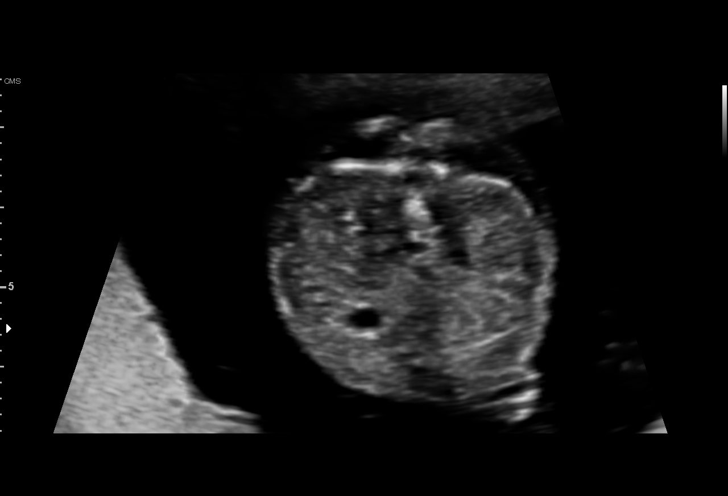

[14 of 28 positions shown; findings below may reference images not displayed]

[REDACTED]. [HOSPITAL]
DO

Indications

18 weeks gestation of pregnancy
Detailed fetal anatomic survey                 Z36
Medical complication of pregnancy (pituitary
macroadenoma)
OB History

Gravidity:    1         Term:   0        Prem:   0        SAB:   0
TOP:          0       Ectopic:  0        Living: 0
Fetal Evaluation

Num Of Fetuses:     1
Fetal Heart         150
Rate(bpm):
Cardiac Activity:   Observed
Presentation:       Breech
Placenta:           Posterior, above cervical os
P. Cord Insertion:  Visualized

Amniotic Fluid
AFI FV:      Subjectively within normal limits

Largest Pocket(cm)
5.18
Biometry

BPD:      40.7  mm     G. Age:  18w 2d         47  %    CI:        73.87   %    70 - 86
FL/HC:      17.6   %    15.8 - 18
HC:      150.4  mm     G. Age:  18w 1d         27  %    HC/AC:      1.11        1.07 -
AC:      134.9  mm     G. Age:  19w 0d         64  %    FL/BPD:     64.9   %
FL:       26.4  mm     G. Age:  18w 0d         30  %    FL/AC:      19.6   %    20 - 24
HUM:      26.3  mm     G. Age:  18w 2d         50  %
Est. FW:     242  gm      0 lb 9 oz     48  %
Gestational Age

LMP:           18w 3d        Date:  08/01/15                 EDD:   05/07/16
U/S Today:     18w 3d                                        EDD:   05/07/16
Best:          18w 3d     Det. By:  LMP  (08/01/15)          EDD:   05/07/16
Anatomy

Cranium:               Appears normal         Aortic Arch:            Appears normal
Cavum:                 Appears normal         Ductal Arch:            Appears normal
Ventricles:            Appears normal         Diaphragm:              Appears normal
Choroid Plexus:        Appears normal         Stomach:                Appears normal, left
sided
Cerebellum:            Appears normal         Abdomen:                Appears normal
Posterior Fossa:       Appears normal         Abdominal Wall:         Appears nml (cord
insert, abd wall)
Nuchal Fold:           Appears normal         Cord Vessels:           Appears normal (3
vessel cord)
Face:                  Appears normal         Kidneys:                Appear normal
(orbits and profile)
Lips:                  Appears normal         Bladder:                Appears normal
Thoracic:              Appears normal         Spine:                  Appears normal
Heart:                 Appears normal         Upper Extremities:      Appears normal
(4CH, axis, and
situs)
RVOT:                  Appears normal         Lower Extremities:      Appears normal
LVOT:                  Appears normal

Other:  Fetus appears to be a female. Heels and 5th digit visualized. Nasal
bone visualized. Technically difficult due to fetal position.
Cervix Uterus Adnexa

Cervix
Length:            3.1  cm.
Normal appearance by transabdominal scan.

Uterus
No abnormality visualized.

Left Ovary
Within normal limits.

Right Ovary
Not visualized.

Cul De Sac:   No free fluid seen.

Adnexa:       No abnormality visualized.
Impression

Single IUP at 18w 3d
Hx of pituitary macroadenoma
Normal fetal anatomic survey
Posterior placenta without previa
Normal amniotic fluid volume
Recommendations

Recommend follow-up ultrasound examination in 4 weeks for
interval growth

## 2018-09-05 IMAGING — US US MFM OB FOLLOW-UP
1 series · 14 of 28 positions shown · non-contrast
Comparison: none

[Series 1: us mfm ob follow-up · 62 acquisitions, 14 frames shown]
[im 3/62]
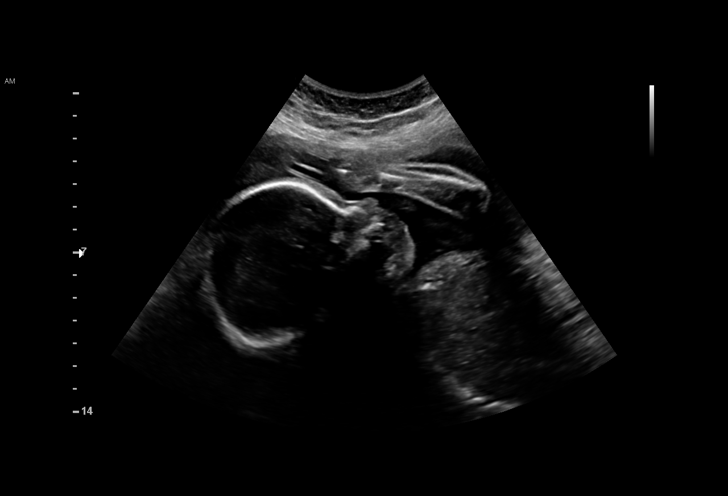
[im 7/62]
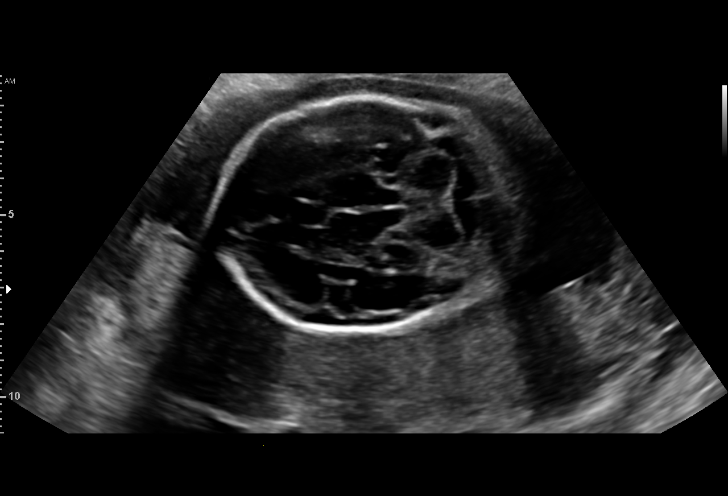
[im 12/62]
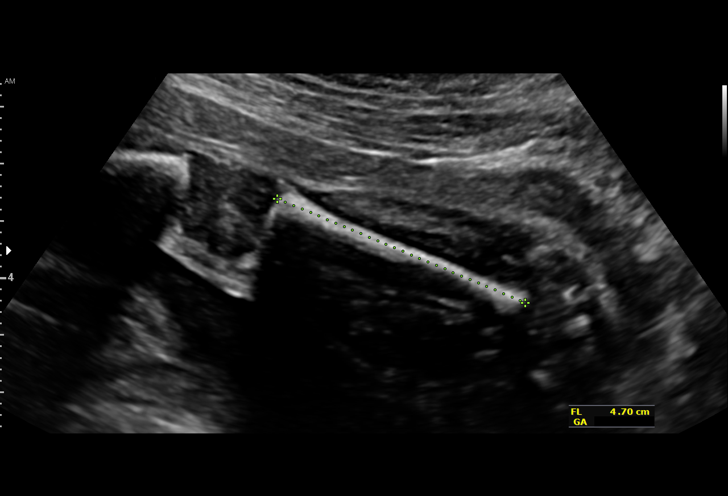
[im 16/62]
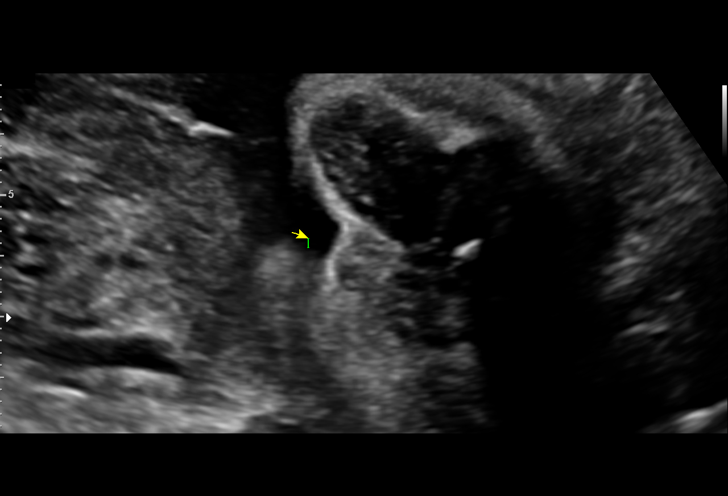
[im 21/62]
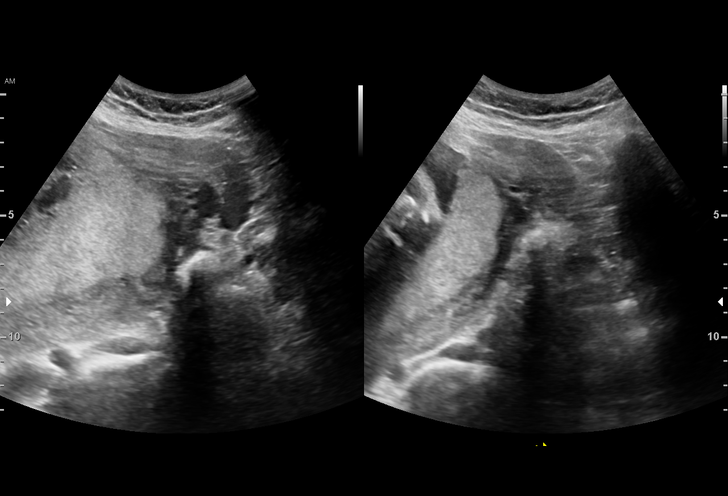
[im 25/62]
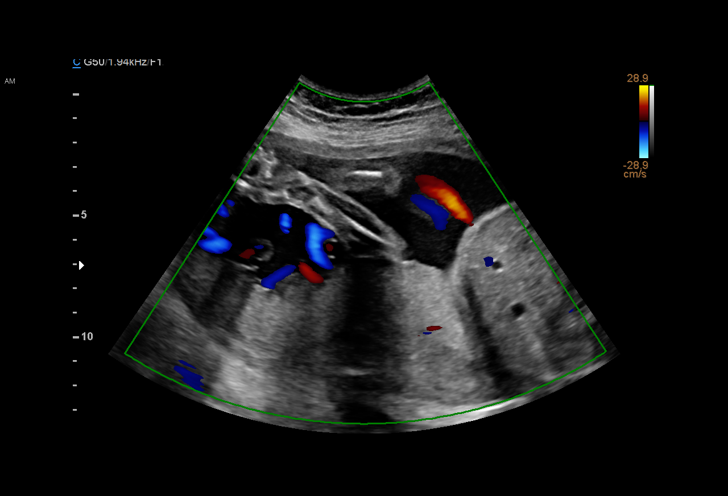
[im 30/62]
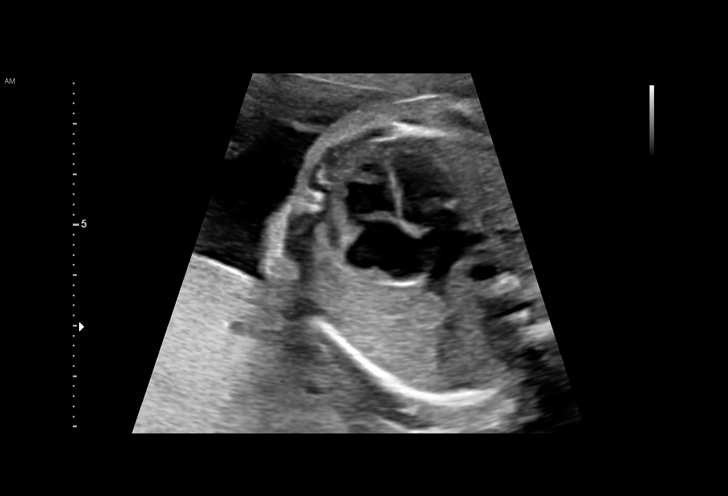
[im 34/62]
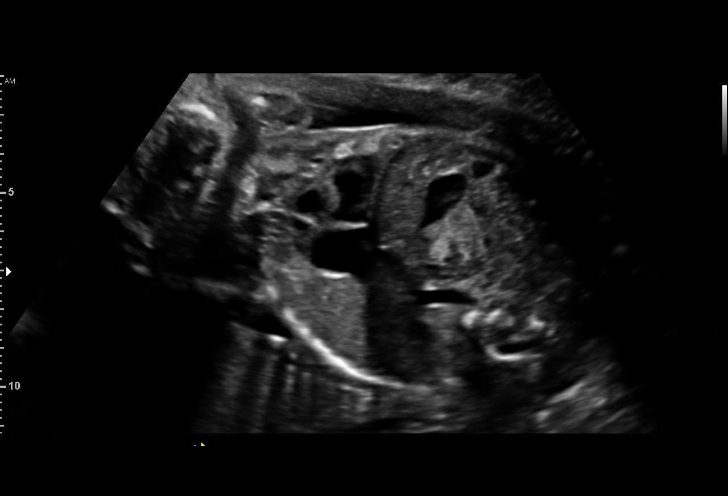
[im 39/62]
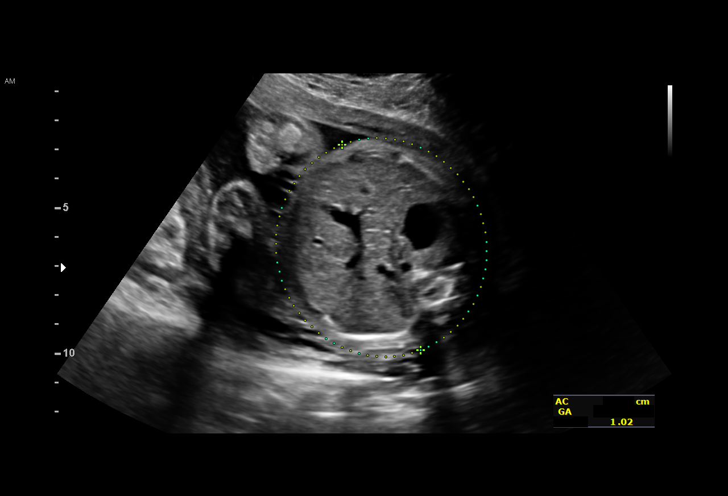
[im 43/62]
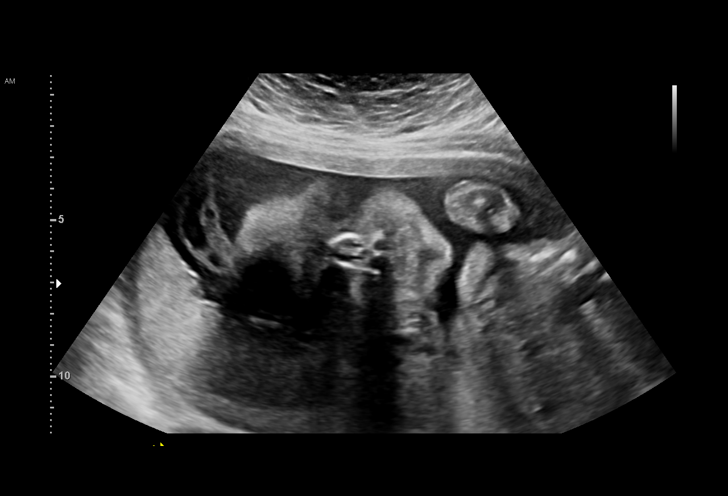
[im 48/62]
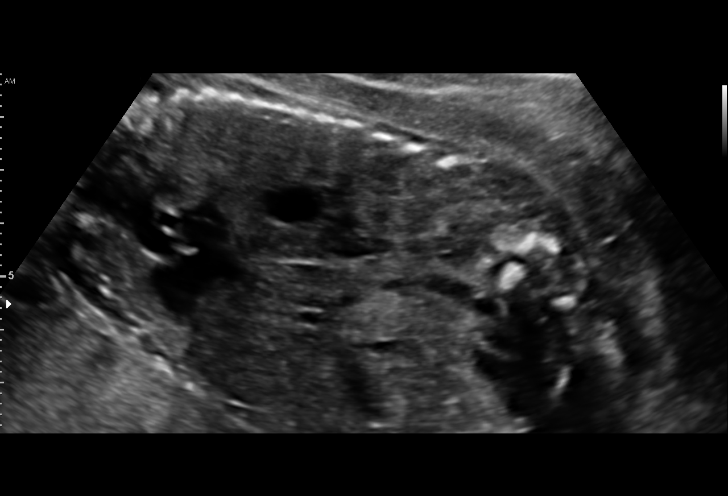
[im 52/62]
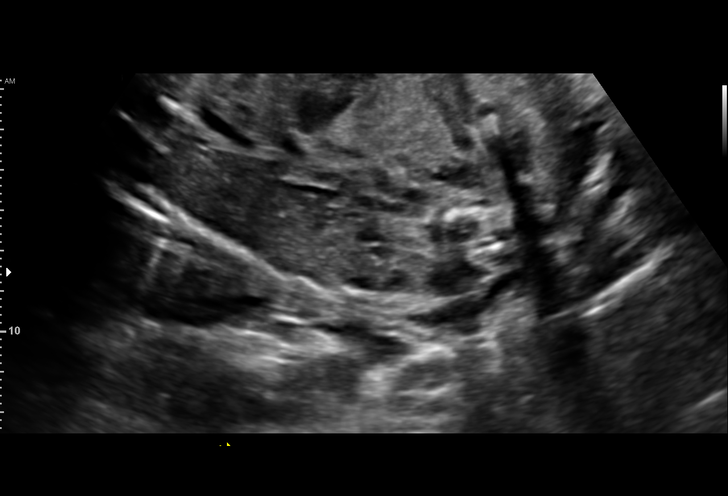
[im 57/62]
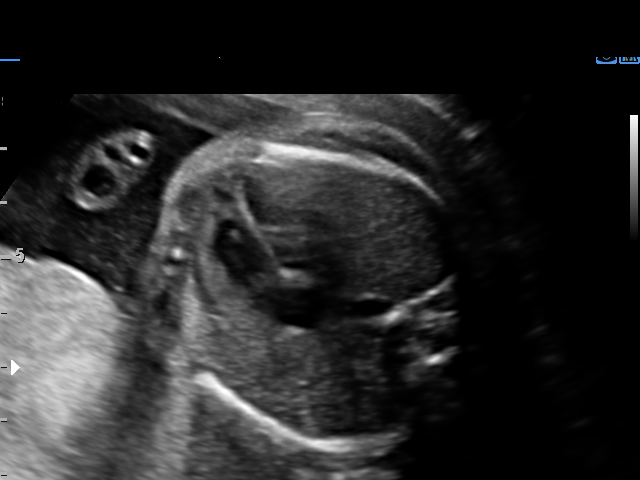
[im 62/62]
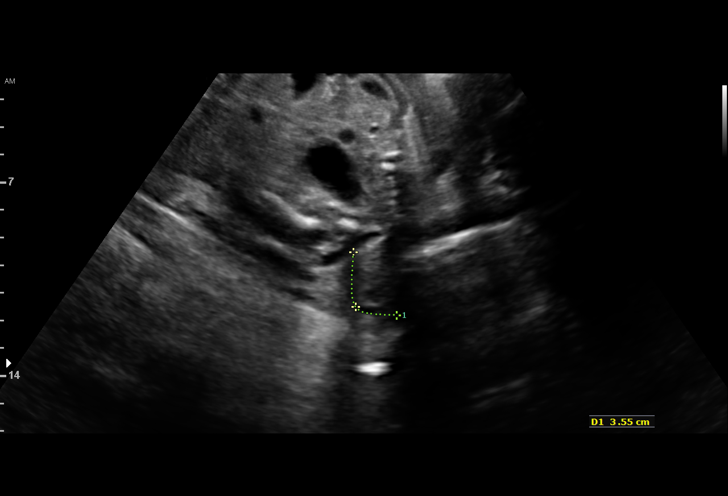

[14 of 28 positions shown; findings below may reference images not displayed]

[REDACTED]. [HOSPITAL]
DO

Indications

26 weeks gestation of pregnancy
Medical complication of pregnancy (pituitary
macroadenoma)
OB History

Gravidity:    1         Term:   0        Prem:   0        SAB:   0
TOP:          0       Ectopic:  0        Living: 0
Fetal Evaluation

Num Of Fetuses:     1
Cardiac Activity:   Observed
Placenta:           Posterior, above cervical os
P. Cord Insertion:  Visualized, central

Amniotic Fluid
AFI FV:      Subjectively within normal limits

Largest Pocket(cm)
5.24
Biometry

BPD:      64.3  mm     G. Age:  26w 0d         25  %    CI:        70.23   %    70 - 86
FL/HC:      19.0   %    18.6 -
HC:      244.7  mm     G. Age:  26w 4d         29  %    HC/AC:      1.07        1.04 -
AC:      228.6  mm     G. Age:  27w 2d         66  %    FL/BPD:     72.5   %    71 - 87
FL:       46.6  mm     G. Age:  25w 4d         14  %    FL/AC:      20.4   %    20 - 24

Est. FW:     944  gm      2 lb 1 oz     55  %
Gestational Age
LMP:           26w 3d        Date:  08/01/15                 EDD:   05/07/16
U/S Today:     26w 3d                                        EDD:   05/07/16
Best:          26w 3d     Det. By:  LMP  (08/01/15)          EDD:   05/07/16
Anatomy

Cranium:               Appears normal         Aortic Arch:            Previously seen
Cavum:                 Appears normal         Ductal Arch:            Previously seen
Ventricles:            Appears normal         Diaphragm:              Appears normal
Choroid Plexus:        Previously seen        Stomach:                Appears normal, left
sided
Cerebellum:            Previously seen        Abdomen:                Appears normal
Posterior Fossa:       Previously seen        Abdominal Wall:         Appears nml (cord
insert, abd wall)
Nuchal Fold:           Previously seen        Cord Vessels:           Previously seen
Face:                  Orbits and profile     Kidneys:                Appear normal
previously seen
Lips:                  Appears normal         Bladder:                Appears normal
Thoracic:              Appears normal         Spine:                  Previously seen
Heart:                 Appears normal         Upper Extremities:      Previously seen
(4CH, axis, and
situs)
RVOT:                  Appears normal         Lower Extremities:      Previously seen
LVOT:                  Appears normal

Other:  Fetus appears to be a female. Heels and 5th digit previously seen.
Nasal bone visualized.
Cervix Uterus Adnexa

Cervix
Length:            3.6  cm.
Normal appearance by transabdominal scan.

Uterus
No abnormality visualized.

Left Ovary
Not visualized.

Right Ovary
Not visualized.

Adnexa:       No abnormality visualized.
Impression

Singleton intrauterine pregnancy at 26+3 weeks
Review of the anatomy shows no sonographic markers for
aneuploidy or structural anomalies
Amniotic fluid volume is normal with a MVP of 5.4cm
Estimated fetal weight is 944g which is growth in the 55th
percentile
Recommendations

The most recent MRI from 01/30/16 showed minimal change
in the macroadenoma. she has an appointment scheduled
with endocrinology, but none with opthamolgy. she describes
"seeing things at the corner of my eye" that might me a
precursor to bitemporal hemianopsia. Would recommend
opthamologic consult. we would like to confirm normal growth
in 8 weeks

## 2018-12-05 ENCOUNTER — Other Ambulatory Visit: Payer: Self-pay

## 2018-12-05 ENCOUNTER — Encounter: Payer: Self-pay | Admitting: Adult Health

## 2018-12-05 ENCOUNTER — Ambulatory Visit: Payer: 59 | Admitting: Adult Health

## 2018-12-05 VITALS — BP 118/76 | HR 81 | Temp 97.5°F | Ht 63.0 in | Wt 146.0 lb

## 2018-12-05 DIAGNOSIS — H01004 Unspecified blepharitis left upper eyelid: Secondary | ICD-10-CM

## 2018-12-05 DIAGNOSIS — K5900 Constipation, unspecified: Secondary | ICD-10-CM

## 2018-12-05 MED ORDER — NEOMYCIN-POLYMYXIN-DEXAMETH 3.5-10000-0.1 OP SUSP: 1.0000 [drp] | Freq: Four times a day (QID) | OPHTHALMIC | 0 refills | Status: AC

## 2018-12-05 NOTE — Progress Notes (Signed)
Assessment and Plan:  Joyce Chapman was seen today for eye problem.  Diagnoses and all orders for this visit:  Blepharitis of left upper eyelid, unspecified type Possible mild hordeolum; advised both conditions typically does not require abx treatment to resolve Advised warm compresses 3-4 times a day, gently clean eyelids with baby shampoo on qtip or wash cloth and massage lid Hydrating eye drops to help irrigate throughout the day  Avoid wearing eye makeup and switch to new once resolved Can try eye drops if symptoms not improving in another 4-5 days with conservative interventions Call office with any new symptoms, if not resolving Ophth referral with any vision changes -     neomycin-polymyxin b-dexamethasone (MAXITROL) 3.5-10000-0.1 SUSP; Place 1 drop into the left eye 4 (four) times daily for 7 days.   Constipation, unspecified constipation type Chronic; had to cancel previous referral by Estill Bamberg - requesting new referral today Family history of GI cancers and wanting colonoscopy  Has tried conservative interventions for constipation but with persistent severe sx -     Ambulatory referral to Gastroenterology  Further disposition pending results of labs. Discussed med's effects and SE's.   Over 15 minutes of exam, counseling, chart review, and critical decision making was performed.   No future appointments.  ------------------------------------------------------------------------------------------------------------------   HPI BP 118/76   Pulse 81   Temp (!) 97.5 F (36.4 C)   Ht 5\' 3"  (1.6 m)   Wt 146 lb (66.2 kg)   SpO2 99%   BMI 25.86 kg/m   33 y.o.female presents for evaluation of left eyelid redness, swelling that began yesterday, was worse this AM, very tender, scant inner eye discharge this AM; did green tea bag compress which has resolved this previously but was concerned due to much worse symptoms this AM.   Denies URI sx recently, vision changes; no recent hx of  allergies. Not a contact wearer. Denies gritty or foreign body sensation.   Doesn't wear contacts, no recent pool use.    She has severe peristent constipation; may have BM every 2 weeks, worse, has family history of colon and gastric cancers, was referred for GI evaluation last year after evaluation on 07/26/2017 by Estill Bamberg but cancelled due to second pregnancy. Requesting referral back to discuss colonoscopy    Past Medical History:  Diagnosis Date  . Pituitary adenoma ([place])    DX 2015  . Prolactinoma ([facility])      Allergies  Allergen Reactions  . Hydrocodone Itching  . Sudafed [Pseudoephedrine Hcl] Palpitations    Current Outpatient Medications on File Prior to Visit  Medication Sig  . acetaminophen (TYLENOL) 325 MG tablet Take 2 tablets (650 mg total) by mouth every 6 (six) hours as needed (for pain scale < 4).  . cabergoline (DOSTINEX) 0.5 MG tablet Take 0.25 mg by mouth 2 (two) times a week.  Marland Kitchen ibuprofen (ADVIL,MOTRIN) 600 MG tablet Take 1 tablet (600 mg total) by mouth every 6 (six) hours as needed.  . Multiple Vitamins-Minerals (MULTIVITAMIN WOMEN PO) Take by mouth. Take one tablet daily  . Prenatal Vit-Fe Fumarate-FA (PRENATAL MULTIVITAMIN) TABS tablet Take 1 tablet by mouth daily at 12 noon.  . traMADol (ULTRAM) 50 MG tablet Take 1 tablet (50 mg total) by mouth every 6 (six) hours as needed.   No current facility-administered medications on file prior to visit.     ROS: all negative except above.   Physical Exam:  BP 118/76   Pulse 81   Temp (!) 97.5 F (36.4 C)  Ht 5\' 3"  (1.6 m)   Wt 146 lb (66.2 kg)   SpO2 99%   BMI 25.86 kg/m   General Appearance: Well nourished, in no apparent distress. Eyes: PERRLA, EOMs, conjunctiva no swelling or erythema, left upper eye lid is erythematous and mildly swollen, mildly tender; ? Subtle stye; no entropion, ectropion, obvious lesion internal or externally;  Sinuses: No Frontal/maxillary tenderness ENT/Mouth: Ext aud canals  clear, TMs without erythema, bulging. No erythema, swelling, or exudate on post pharynx.  Tonsils not swollen or erythematous. Hearing normal.  Neck: Supple Respiratory: Respiratory effort normal Abdomen: Soft, + BS.  Non tender, no guarding, rebound, hernias, masses. Lymphatics: Non tender without lymphadenopathy.  Musculoskeletal: normal gait.  Skin: other skin Warm, dry without rashes, lesions, ecchymosis.  Neuro: Normal muscle tone Psych: Awake and oriented X 3, normal affect, Insight and Judgment appropriate.     Joyce Ribas, NP 4:43 PM [facility]

## 2018-12-05 NOTE — Patient Instructions (Signed)
Blepharitis Blepharitis is inflammation of the eyelids. Blepharitis may happen with:  Reddish, scaly skin around the scalp and eyebrows.  Burning or itching of the eyelids.  Eye discharge at night that causes the eyelashes to stick together in the morning.  Eyelashes that fall out.  Sensitivity to light. Follow these instructions at home: Pay attention to any changes in how your eyes look or feel. Tell your health care provider about any changes. Follow these instructions to help with your condition. Keeping Clean   Wash your hands often.  Wash your eyelids with warm water or with warm water that is mixed with a small amount of baby shampoo. Do this two times per day or as often as needed.  Wash your face and eyebrows at least once a day.  Use a clean towel each time you dry your eyelids. Do not use this towel to clean or dry other areas of your body. Do not share your towel with anyone. General instructions  Avoid wearing makeup until you get better. Do not share makeup with anyone.  Avoid rubbing your eyes.  Apply warm compresses to your eyes 2 times per day for 10 minutes at a time, or as told by your health care provider.  If you were prescribed an antibiotic ointment or steroid drops, apply or use the medicine as told by your health care provider. Do not stop using the medicine even if you feel better.  Keep all follow-up visits as told by your health care provider. This is important. Contact a health care provider if:  Your eyelids feel hot.  You have blisters or a rash on your eyelids.  The condition does not go away in 2-4 days.  The inflammation gets worse. Get help right away if:  You have pain or redness that gets worse or spreads to other parts of your face.  Your vision changes.  You have pain when looking at lights or moving objects.  You have a fever. Summary  Blepharitis is inflammation of the eyelids.  Pay attention to any changes in how your  eyes look or feel. Tell your health care provider about any changes.  Follow home care instructions as told by your health care provider. Wash your hands often. Avoid wearing makeup. Do not rub your eyes.  To treat this condition, use warm compresses and prescription ointments or eye drops.  Let your health care provider know if you have vision changes, blisters or rash on eyelids, pain that spreads to your face, or warmth on your eyelids. This information is not intended to replace advice given to you by your health care provider. Make sure you discuss any questions you have with your health care provider. Document Released: 04/01/2000 Document Revised: 09/25/2017 Document Reviewed: 09/25/2017 Elsevier Patient Education  2020 [employer] Inc[employer]  

## 2018-12-06 ENCOUNTER — Encounter: Payer: Self-pay | Admitting: Gastroenterology

## 2019-01-01 ENCOUNTER — Other Ambulatory Visit: Payer: Self-pay

## 2019-01-01 ENCOUNTER — Ambulatory Visit: Payer: 59 | Admitting: Gastroenterology

## 2019-01-01 ENCOUNTER — Other Ambulatory Visit (INDEPENDENT_AMBULATORY_CARE_PROVIDER_SITE_OTHER): Payer: 59

## 2019-01-01 ENCOUNTER — Encounter: Payer: Self-pay | Admitting: Gastroenterology

## 2019-01-01 VITALS — BP 116/72 | HR 84 | Temp 97.8°F | Ht 63.0 in | Wt 142.0 lb

## 2019-01-01 DIAGNOSIS — R7401 Elevation of levels of liver transaminase levels: Secondary | ICD-10-CM

## 2019-01-01 DIAGNOSIS — R740 Nonspecific elevation of levels of transaminase and lactic acid dehydrogenase [LDH]: Secondary | ICD-10-CM | POA: Diagnosis not present

## 2019-01-01 DIAGNOSIS — K5909 Other constipation: Secondary | ICD-10-CM

## 2019-01-01 DIAGNOSIS — Z800 Family history of malignant neoplasm of digestive organs: Secondary | ICD-10-CM

## 2019-01-01 DIAGNOSIS — K625 Hemorrhage of anus and rectum: Secondary | ICD-10-CM

## 2019-01-01 LAB — HEPATIC FUNCTION PANEL
ALT: 16 U/L (ref 0–35)
AST: 17 U/L (ref 0–37)
Albumin: 4.7 g/dL (ref 3.5–5.2)
Alkaline Phosphatase: 54 U/L (ref 39–117)
Bilirubin, Direct: 0 mg/dL (ref 0.0–0.3)
Total Bilirubin: 0.4 mg/dL (ref 0.2–1.2)
Total Protein: 6.9 g/dL (ref 6.0–8.3)

## 2019-01-01 MED ORDER — LINACLOTIDE 290 MCG PO CAPS: 290.0000 ug | ORAL_CAPSULE | Freq: Every day | ORAL | 0 refills | Status: DC

## 2019-01-01 MED ORDER — LINACLOTIDE 145 MCG PO CAPS: 145.0000 ug | ORAL_CAPSULE | Freq: Every day | ORAL | 0 refills | Status: DC

## 2019-01-01 NOTE — Patient Instructions (Signed)
If you are age 35 or older, your body mass index should be between 23-30. Your Body mass index is 25.15 kg/m. If this is out of the aforementioned range listed, please consider follow up with your Primary Care Provider.  If you are age 68 or younger, your body mass index should be between 19-25. Your Body mass index is 25.15 kg/m. If this is out of the aformentioned range listed, please consider follow up with your Primary Care Provider.   To help prevent the possible spread of infection to our patients, communities, and staff; we will be implementing the following measures:  As of now we are not allowing any visitors/family members to accompany you to any upcoming appointments with [facility]. If you have any concerns about this please contact our office to discuss prior to the appointment.   Please go to the lab in the basement of our building to have lab work done as you leave today. Hit "B" for basement when you get on the elevator.  When the doors open the lab is on your left.  We will call you with the results. Thank you.  It has been recommended to you by your physician that you have a(n) Colonoscopy completed. Per your request, we did not schedule the procedure(s) today. Please contact our office at [phone]  in a couple of weeks in early October to schedule your procedure for November.  We have given you samples of the following medication to take: Linzess 168mcg: Take once daily in the morning You can increase to 270mcg once daily if needed.  Please call us in a couple of weeks and let us know if this is helpful and we can send a prescription for the strength that is most effective.  Thank you for entrusting me with your care and for choosing [facility], Dr. Handley Cellar

## 2019-01-01 NOTE — Progress Notes (Signed)
HPI :  33 y/o female with a history of prolactinoma, referred here by Liane Comber NP for constipation.  She reports she is had constipation as long as she can remember since childhood.  She has a bowel movement once every week to every 2 weeks at a time.  She feels a lot of discomfort in her abdomen with bloating frequently.  When she has a bowel movement this reliably relieves the symptoms for a few days until they recur again.  She feels a lot of bloating in her abdomen after she eats.  Her stools are hard and she has straining to move her bowels.  She does have occasional rectal bleeding, red blood noted in the stool at times and on the toilet paper.  She has used stool softener such as Colace which have not helped.  She has changed her diet to eat a very high-fiber diet and is tried probiotics which has not provided much benefit.  On her father side of the family her uncle had colon cancer in his 58s, and her grandmother had colon cancer in her 70s.  She is not sure if her father has had colon polyps or not.  Is never had a colonoscopy.  She has not tried any other regimens.  Incidentally in looking at labs, in April 2019 she had an ALT elevation to the mid 100s and AST to the 80s She had an extensive serologic work-up at that time with a positive ANA to 1:80, otherwise negative smooth muscle antibody.  Immunoglobulin level were not sent.  Her ALT on August 02, 2016 was 63, she is not had any further labs since that time.  She was pregnant at the time of when the labs were drawn.  She denies any known history of liver disease.    Past Medical History:  Diagnosis Date  . Anxiety   . IBS (irritable bowel syndrome)   . Pituitary adenoma ([place])    DX 2015  . Prolactinoma [facility])      Past Surgical History:  Procedure Laterality Date  . TONSILLECTOMY    . WISDOM TOOTH EXTRACTION     Family History  Problem Relation Age of Onset  . Transient ischemic attack Mother   . Melanoma Mother    . Heart attack Father   . Heart disease Father   . Colon cancer Paternal Uncle   . Diabetes Maternal Grandfather   . Throat cancer Maternal Grandfather   . Stomach cancer Paternal Grandmother   . Colon cancer Paternal Grandmother    Social History   Tobacco Use  . Smoking status: Former Smoker    Packs/day: 0.05    Types: Cigarettes    Quit date: 2016    Years since quitting: 4.7  . Smokeless tobacco: Never Used  Substance Use Topics  . Alcohol use: Yes    Alcohol/week: 0.0 standard drinks    Comment: social  . Drug use: No   Current Outpatient Medications  Medication Sig Dispense Refill  . cabergoline (DOSTINEX) 0.5 MG tablet Take 0.25 mg by mouth 2 (two) times a week.    . Multiple Vitamins-Minerals (MULTIVITAMIN WOMEN PO) Take by mouth. Take one tablet daily     No current facility-administered medications for this visit.    Allergies  Allergen Reactions  . Hydrocodone Itching  . Sudafed [Pseudoephedrine Hcl] Palpitations     Review of Systems: All systems reviewed and negative except where noted in HPI.   Lab Results  Component Value Date  WBC 9.8 03/09/2018   HGB 10.5 (L) 03/09/2018   HCT 32.1 (L) 03/09/2018   MCV 90.9 03/09/2018   PLT 183 03/09/2018     Physical Exam: BP 116/72   Pulse 84   Temp 97.8 F (36.6 C) (Temporal)   Ht 5\' 3"  (1.6 m)   Wt 142 lb (64.4 kg)   BMI 25.15 kg/m  Constitutional: Pleasant,well-developed, female in no acute distress. HEENT: Normocephalic and atraumatic. Conjunctivae are normal. No scleral icterus. Neck supple.  Cardiovascular: Normal rate, regular rhythm.  Pulmonary/chest: Effort normal and breath sounds normal. No wheezing, rales or rhonchi. Abdominal: Soft, nondistended, nontender.  There are no masses palpable. No hepatomegaly. Extremities: no edema Lymphadenopathy: No cervical adenopathy noted. Neurological: Alert and oriented to person place and time. Skin: Skin is warm and dry. No rashes noted.  Psychiatric: Normal mood and affect. Behavior is normal.   ASSESSMENT AND PLAN: 33 year old female here for new patient assessment of the following:  Chronic constipation / Rectal bleeding / Family history of colon cancer - longstanding constipation, associated intermittent rectal bleeding.  She has 2 second-degree relatives who had colon cancer, one at a young age.  I discussed constipation and differential for her for this issue.  I suspect she has slow transit constipation, while outlet dysfunction also possible.  Given her intermittent rectal bleeding, while it is probably due to hemorrhoids in the setting of constipation, I offered her colonoscopy to ensure nothing else is going on and for peace of mind as she is anxious about this.  With her second-degree family history of colon cancer would start screening at age 70, but she wants to proceed now for symptoms as above.  I discussed risks and benefits of colonoscopy and anesthesia with her and she want to proceed.  In the interim I discussed options to treat her constipation.  We will give her a trial of Linzess 145 mcg once daily, can increase to 290 mcg a day if needed.  She can also use MiraLAX in addition as needed over-the-counter.  If this does not provide benefit we will try some other regimens.  I counseled her on the side effects of Linzess causing abdominal cramping, he understands and wants to try it.  Further recommendations pending results of her exam and her course.  Elevated ALT - elevation x2 in 2019 while pregnant.  Will repeat at this time to see if this is persistently elevated.  If so we will need further evaluation.  She agreed  [place] Cellar, MD [facility]  CC: Liane Comber, NP

## 2019-01-14 ENCOUNTER — Encounter: Payer: Self-pay | Admitting: Gastroenterology

## 2019-01-25 ENCOUNTER — Other Ambulatory Visit: Payer: Self-pay | Admitting: Gastroenterology

## 2019-01-25 MED ORDER — LINACLOTIDE 290 MCG PO CAPS: 290.0000 ug | ORAL_CAPSULE | Freq: Every day | ORAL | 3 refills | Status: DC

## 2019-01-25 NOTE — Telephone Encounter (Signed)
Rx sent 

## 2019-02-22 ENCOUNTER — Encounter: Payer: Self-pay | Admitting: Gastroenterology

## 2019-02-22 ENCOUNTER — Other Ambulatory Visit: Payer: Self-pay

## 2019-02-22 ENCOUNTER — Ambulatory Visit (AMBULATORY_SURGERY_CENTER): Payer: 59 | Admitting: *Deleted

## 2019-02-22 VITALS — Temp 97.3°F | Ht 63.0 in | Wt 136.0 lb

## 2019-02-22 DIAGNOSIS — Z800 Family history of malignant neoplasm of digestive organs: Secondary | ICD-10-CM

## 2019-02-22 DIAGNOSIS — Z1159 Encounter for screening for other viral diseases: Secondary | ICD-10-CM

## 2019-02-22 DIAGNOSIS — K625 Hemorrhage of anus and rectum: Secondary | ICD-10-CM

## 2019-02-22 DIAGNOSIS — K5900 Constipation, unspecified: Secondary | ICD-10-CM

## 2019-02-22 MED ORDER — SUPREP BOWEL PREP KIT 17.5-3.13-1.6 GM/177ML PO SOLN: 1.0000 | Freq: Once | ORAL | 0 refills | Status: AC

## 2019-02-22 NOTE — Progress Notes (Signed)
No egg or soy allergy known to patient  No issues with past sedation with any surgeries  or procedures, no intubation problems  No diet pills per patient No home 02 use per patient  No blood thinners per patient  Pt states  issues with constipation - on Linzess and occ has diarrhea but still occ has hard stool even on Linzess-  No A fib or A flutter  EMMI video sent to pt's e mail   Ct 11-18 at 320 pm   Due to the COVID-19 pandemic we are asking patients to follow these guidelines. Please only bring one care partner. Please be aware that your care partner may wait in the car in the parking lot or if they feel like they will be too hot to wait in the car, they may wait in the lobby on the 4th floor. All care partners are required to wear a mask the entire time (we do not have any that we can provide them), they need to practice social distancing, and we will do a Covid check for all patient's and care partners when you arrive. Also we will check their temperature and your temperature. If the care partner waits in their car they need to stay in the parking lot the entire time and we will call them on their cell phone when the patient is ready for discharge so they can bring the car to the front of the building. Also all patient's will need to wear a mask into building.  Suprep $15 coupon

## 2019-03-06 ENCOUNTER — Other Ambulatory Visit: Payer: Self-pay | Admitting: Gastroenterology

## 2019-03-06 ENCOUNTER — Ambulatory Visit (INDEPENDENT_AMBULATORY_CARE_PROVIDER_SITE_OTHER): Payer: 59

## 2019-03-06 DIAGNOSIS — Z1159 Encounter for screening for other viral diseases: Secondary | ICD-10-CM

## 2019-03-07 LAB — SARS CORONAVIRUS 2 (TAT 6-24 HRS): SARS Coronavirus 2: NEGATIVE

## 2019-03-08 ENCOUNTER — Other Ambulatory Visit: Payer: Self-pay

## 2019-03-08 ENCOUNTER — Encounter: Payer: Self-pay | Admitting: Gastroenterology

## 2019-03-08 ENCOUNTER — Ambulatory Visit (AMBULATORY_SURGERY_CENTER): Payer: 59 | Admitting: Gastroenterology

## 2019-03-08 VITALS — BP 105/48 | HR 67 | Temp 98.4°F | Resp 16 | Ht 63.0 in | Wt 138.0 lb

## 2019-03-08 DIAGNOSIS — K648 Other hemorrhoids: Secondary | ICD-10-CM | POA: Diagnosis not present

## 2019-03-08 DIAGNOSIS — K5730 Diverticulosis of large intestine without perforation or abscess without bleeding: Secondary | ICD-10-CM

## 2019-03-08 DIAGNOSIS — Z800 Family history of malignant neoplasm of digestive organs: Secondary | ICD-10-CM

## 2019-03-08 DIAGNOSIS — K625 Hemorrhage of anus and rectum: Secondary | ICD-10-CM

## 2019-03-08 MED ORDER — SODIUM CHLORIDE 0.9 % IV SOLN: 500.0000 mL | Freq: Once | INTRAVENOUS | Status: DC

## 2019-03-08 NOTE — Progress Notes (Signed)
Temp LC V/s CW I have reviewed the patient's medical history in detail and updated the computerized patient record. 

## 2019-03-08 NOTE — Op Note (Signed)
[facility] Patient Name: Joyce Chapman Procedure Date: 03/08/2019 9:09 AM MRN: [id number] Endoscopist: Remo Lipps P. Havery Moros , MD Age: 33 Referring MD:  Date of Birth: [birth year] Gender: Female Account #: [id number] Procedure:                Colonoscopy Indications:              Rectal bleeding, chronic constipation, 2 second                            degree family members with colon cancer - symptoms                            improved with Linzess 215mcg but not significantly Medicines:                Monitored Anesthesia Care Procedure:                Pre-Anesthesia Assessment:                           - Prior to the procedure, a History and Physical                            was performed, and patient medications and                            allergies were reviewed. The patient's tolerance of                            previous anesthesia was also reviewed. The risks                            and benefits of the procedure and the sedation                            options and risks were discussed with the patient.                            All questions were answered, and informed consent                            was obtained. Prior Anticoagulants: The patient has                            taken no previous anticoagulant or antiplatelet                            agents. ASA Grade Assessment: I - A normal, healthy                            patient. After reviewing the risks and benefits,                            the patient was deemed in satisfactory condition to  undergo the procedure.                           After obtaining informed consent, the colonoscope                            was passed under direct vision. Throughout the                            procedure, the patient's blood pressure, pulse, and                            oxygen saturations were monitored continuously. The                            Colonoscope was  introduced through the anus and                            advanced to the the terminal ileum, with                            identification of the appendiceal orifice and IC                            valve. The colonoscopy was performed without                            difficulty. The patient tolerated the procedure                            well. The quality of the bowel preparation was                            good. The terminal ileum, ileocecal valve,                            appendiceal orifice, and rectum were photographed. Scope In: 9:11:06 AM Scope Out: 9:34:02 AM Scope Withdrawal Time: 0 hours 16 minutes 3 seconds  Total Procedure Duration: 0 hours 22 minutes 56 seconds  Findings:                 The perianal and digital rectal examinations were                            normal.                           The terminal ileum appeared normal.                           A single large-mouthed diverticulum was found in                            the ascending colon.  The colon was quite tortuous.                           Internal hemorrhoids were found during                            retroflexion. The hemorrhoids were small.                           The exam was otherwise without abnormality. Complications:            No immediate complications. Estimated blood loss:                            None. Estimated Blood Loss:     Estimated blood loss: none. Impression:               - The examined portion of the ileum was normal.                           - Diverticulosis in the ascending colon.                           - Tortuous colon.                           - Internal hemorrhoids.                           - The examination was otherwise normal.                           - No polyps.                           Bleeding is coming from internal hemorrhoids in the                            setting of chronic constipation. Recommendation:            - Patient has a contact number available for                            emergencies. The signs and symptoms of potential                            delayed complications were discussed with the                            patient. Return to normal activities tomorrow.                            Written discharge instructions were provided to the                            patient.                           -  Resume previous diet.                           - Continue present medications.                           - Add Miralax once to twice daily to daily Linzess.                            If symptoms of constipation persist despite this,                            contact me for discussion of other options Carlota Raspberry. Armbruster, MD 03/08/2019 9:39:06 AM This report has been signed electronically.

## 2019-03-08 NOTE — Progress Notes (Signed)
Temp check by:JB  Vital check by:CW  The patient states no changes in medical or surgical history since pre-visit screening on 02/22/2019.

## 2019-03-08 NOTE — Progress Notes (Signed)
PT taken to PACU. Monitors in place. VSS. Report given to RN. 

## 2019-03-08 NOTE — Patient Instructions (Signed)
HANDOUTS INCLUDE:DIVERTICULOSIS AND HEMORRHOIDS  AD MIRALAX ONCE TO TWICE DAILY  TO DAILY LINZESS  IF SYMPTOMS OF CONSTIPATION PERSIST DESPITE THIS. CONTACT DR Wilma Flavin TO DISCUSS OPTIONS.  YOU HAD AN ENDOSCOPIC PROCEDURE TODAY AT [facility]:   Refer to the procedure report that was given to you for any specific questions about what was found during the examination.  If the procedure report does not answer your questions, please call your gastroenterologist to clarify.  If you requested that your care partner not be given the details of your procedure findings, then the procedure report has been included in a sealed envelope for you to review at your convenience later.  YOU SHOULD EXPECT: Some feelings of bloating in the abdomen. Passage of more gas than usual.  Walking can help get rid of the air that was put into your GI tract during the procedure and reduce the bloating. If you had a lower endoscopy (such as a colonoscopy or flexible sigmoidoscopy) you may notice spotting of blood in your stool or on the toilet paper. If you underwent a bowel prep for your procedure, you may not have a normal bowel movement for a few days.  Please Note:  You might notice some irritation and congestion in your nose or some drainage.  This is from the oxygen used during your procedure.  There is no need for concern and it should clear up in a day or so.  SYMPTOMS TO REPORT IMMEDIATELY:   Following lower endoscopy (colonoscopy or flexible sigmoidoscopy):  Excessive amounts of blood in the stool  Significant tenderness or worsening of abdominal pains  Swelling of the abdomen that is new, acute  Fever of 100F or higher   For urgent or emergent issues, a gastroenterologist can be reached at any hour by calling [phone].   DIET:  We do recommend a small meal at first, but then you may proceed to your regular diet.  Drink plenty of fluids but you should avoid alcoholic beverages  for 24 hours.  ACTIVITY:  You should plan to take it easy for the rest of today and you should NOT DRIVE or use heavy machinery until tomorrow (because of the sedation medicines used during the test).    FOLLOW UP: Our staff will call the number listed on your records 48-72 hours following your procedure to check on you and address any questions or concerns that you may have regarding the information given to you following your procedure. If we do not reach you, we will leave a message.  We will attempt to reach you two times.  During this call, we will ask if you have developed any symptoms of COVID 19. If you develop any symptoms (ie: fever, flu-like symptoms, shortness of breath, cough etc.) before then, please call [phone].  If you test positive for Covid 19 in the 2 weeks post procedure, please call and report this information to [place].    If any biopsies were taken you will be contacted by phone or by letter within the next 1-3 weeks.  Please call us at [phone] if you have not heard about the biopsies in 3 weeks.    SIGNATURES/CONFIDENTIALITY: You and/or your care partner have signed paperwork which will be entered into your electronic medical record.  These signatures attest to the fact that that the information above on your After Visit Summary has been reviewed and is understood.  Full responsibility of the confidentiality of this discharge information  lies with you and/or your care-partner.

## 2019-03-12 ENCOUNTER — Telehealth: Payer: Self-pay

## 2019-03-12 NOTE — Telephone Encounter (Signed)
  Follow up Call-  Call back number 03/08/2019  Post procedure Call Back phone  # [phone]  Permission to leave phone message Yes  Some recent data might be hidden     Patient questions:  Do you have a fever, pain , or abdominal swelling? No. Pain Score  0 *  Have you tolerated food without any problems? Yes.    Have you been able to return to your normal activities? Yes.    Do you have any questions about your discharge instructions: Diet   No. Medications  No. Follow up visit  No.  Do you have questions or concerns about your Care? No.  Actions: * If pain score is 4 or above: No action needed, pain <4.  1. Have you developed a fever since your procedure? no  2.   Have you had an respiratory symptoms (SOB or cough) since your procedure? no  3.   Have you tested positive for COVID 19 since your procedure no  4.   Have you had any family members/close contacts diagnosed with the COVID 19 since your procedure?  no   If yes to any of these questions please route to Joylene John, RN and Alphonsa Gin, Therapist, sports.

## 2019-03-26 ENCOUNTER — Telehealth: Payer: Self-pay | Admitting: Gastroenterology

## 2019-03-27 ENCOUNTER — Other Ambulatory Visit: Payer: Self-pay

## 2019-03-27 NOTE — Telephone Encounter (Signed)
Called and spoke to patient.  She rec'd a letter that stated her Linzess 282mcg would require Prior Auth starting in 04-19-2019. Will need to redo Prior Auth starting in January.

## 2019-03-27 NOTE — Telephone Encounter (Signed)
PA for Linzess 216mcg sent via CoverMyMeds.

## 2019-03-27 NOTE — Telephone Encounter (Signed)
Office of covem my Meds called and stated that this RX did not need Pre Authorization so she cancelled the request.  Please call pharmacy to fill.

## 2019-04-22 NOTE — Telephone Encounter (Signed)
Resubmitted PA thru CoverMyMeds for Linzess 235mcg

## 2019-04-22 NOTE — Telephone Encounter (Signed)
PA for Linzess 263mcg approved thru 04-21-2020, [facility] [place] reference #: [id number]. Patient [id number]

## 2019-05-13 ENCOUNTER — Telehealth: Payer: Self-pay

## 2019-05-13 NOTE — Telephone Encounter (Signed)
[employer] approved PA for Linzess 263mcg through 04-21-2020. Patient ID: [id number].  Reference # [id number]

## 2019-06-17 ENCOUNTER — Other Ambulatory Visit: Payer: Self-pay

## 2019-06-17 MED ORDER — LINACLOTIDE 290 MCG PO CAPS: 290.0000 ug | ORAL_CAPSULE | Freq: Every day | ORAL | 5 refills | Status: DC

## 2019-06-17 NOTE — Progress Notes (Signed)
Fax request for refill of Linzess. Had Procedure on 03-08-19. Refill sent

## 2020-06-18 ENCOUNTER — Ambulatory Visit: Payer: 59 | Admitting: Nurse Practitioner

## 2020-06-22 ENCOUNTER — Other Ambulatory Visit: Payer: Self-pay

## 2020-06-22 MED ORDER — LINACLOTIDE 290 MCG PO CAPS: 290.0000 ug | ORAL_CAPSULE | Freq: Every day | ORAL | 0 refills | Status: DC

## 2021-02-01 ENCOUNTER — Encounter: Payer: Self-pay | Admitting: Physician Assistant

## 2021-02-01 ENCOUNTER — Ambulatory Visit: Payer: 59 | Admitting: Physician Assistant

## 2021-02-01 VITALS — BP 104/62 | HR 85 | Ht 63.0 in | Wt 128.4 lb

## 2021-02-01 DIAGNOSIS — K5909 Other constipation: Secondary | ICD-10-CM

## 2021-02-01 DIAGNOSIS — K581 Irritable bowel syndrome with constipation: Secondary | ICD-10-CM

## 2021-02-01 MED ORDER — LINACLOTIDE 290 MCG PO CAPS: 290.0000 ug | ORAL_CAPSULE | Freq: Every day | ORAL | 11 refills | Status: DC

## 2021-02-01 NOTE — Progress Notes (Signed)
Subjective:    Patient ID: Joyce Chapman, female    DOB: 28-May-1985, 35 y.o.   MRN: [id number]  HPI Joyce Chapman is a pleasant 35 year old white female, established with Dr. Havery Moros who comes in today for follow-up.  She was last seen in September 2020 with complaints of constipation and rectal bleeding.  She underwent colonoscopy in November 2020 which showed a single large diverticulum in the ascending colon, a tortuous colon and internal hemorrhoids, no polyps.  She was started on a trial of Linzess 290 mcg daily.  She says that the Diablock definitely helps and she is having generally 2 bowel movements per week which she says is much improved over her previous situation.  She generally still has to take MiraLAX at least once a week.  She has no complaints of abdominal pain, no recent rectal bleeding.  She says she had to go off of the medication for a period of time because she could not afford it and constipation was definitely worse without it.  She says that this is one of her higher co-pays and she wonders if there is another medication which may be cheaper. She also mentions intermittent problems with bloating distention and gas particularly if she is constipated.  She avoids artificial sweeteners and is not drinking carbonated beverages.  She does use Gas-X as needed. Other medical issues include a pituitary adenoma and family history of colon cancer in 2 second-degree relatives.  Review of Systems Pertinent positive and negative review of systems were noted in the above HPI section.  All other review of systems was otherwise negative.   Outpatient Encounter Medications as of 02/01/2021  Medication Sig   acetaminophen (TYLENOL) 325 MG tablet Take 650 mg by mouth every 6 (six) hours as needed.   acyclovir (ZOVIRAX) 400 MG tablet acyclovir 400 mg tablet  TAKE 1 TABLET BY MOUTH TWICE DAILY   cabergoline (DOSTINEX) 0.5 MG tablet Take 0.25 mg by mouth 2 (two) times a week.   levonorgestrel  (MIRENA) 20 MCG/DAY IUD 1 each by Intrauterine route once.   Multiple Vitamins-Minerals (MULTIVITAMIN WOMEN PO) Take by mouth. Take one tablet daily   [DISCONTINUED] linaclotide (LINZESS) 290 MCG CAPS capsule Take 1 capsule (290 mcg total) by mouth daily before breakfast. PLEASE SCHEDULE A YEARLY FOLLOW UP FOR FURTHER REFILLS   [DISCONTINUED] valACYclovir (VALTREX) 500 MG tablet Take by mouth.   linaclotide (LINZESS) 290 MCG CAPS capsule Take 1 capsule (290 mcg total) by mouth daily before breakfast.   [DISCONTINUED] Levonorgestrel (KYLEENA) 19.5 MG IUD Kyleena 17.5 mcg/24 hrs (69yrs) 19.5mg  intrauterine device  Take 1 device by intrauterine route.   [DISCONTINUED] pantoprazole (PROTONIX) 40 MG tablet pantoprazole 40 mg tablet,delayed release  TAKE 1 TABLET BY MOUTH DAILY   No facility-administered encounter medications on file as of 02/01/2021.   Allergies  Allergen Reactions   Hydrocodone Itching   Sudafed [Pseudoephedrine Hcl] Palpitations   Patient Active Problem List   Diagnosis Date Noted   Pregnancy 03/08/2018   Indication for care in labor or delivery 05/04/2016   SVD (spontaneous vaginal delivery) 05/04/2016   Hyperprolactinemia ([place]) 01/13/2016   Pituitary adenoma ([place]) 01/13/2016   Fatigue 01/31/2014   Social History   Socioeconomic History   Marital status: Single    Spouse name: Not on file   Number of children: Not on file   Years of education: Not on file   Highest education level: Not on file  Occupational History   Not on file  Tobacco Use  Smoking status: Former    Packs/day: 0.05    Types: Cigarettes    Quit date: 2016    Years since quitting: 6.7   Smokeless tobacco: Never  Vaping Use   Vaping Use: Never used  Substance and Sexual Activity   Alcohol use: Yes    Alcohol/week: 0.0 standard drinks    Comment: social   Drug use: No   Sexual activity: Yes    Birth control/protection: None  Other Topics Concern   Not on file  Social History  Narrative   Not on file   Social Determinants of Health   Financial Resource Strain: Not on file  Food Insecurity: Not on file  Transportation Needs: Not on file  Physical Activity: Not on file  Stress: Not on file  Social Connections: Not on file  Intimate Partner Violence: Not on file    Joyce Chapman's family history includes Colon cancer in her paternal grandmother, paternal uncle, and paternal uncle; Diabetes in her maternal grandfather; Esophageal cancer in her maternal grandfather; Heart attack in her father; Heart disease in her father; Melanoma in her mother; Stomach cancer in her paternal grandmother; Throat cancer in her maternal grandfather; Transient ischemic attack in her mother.      Objective:    Vitals:   02/01/21 1503  BP: 104/62  Pulse: 85    Physical Exam.  Well-developed well-nourished white female in no acute distress.  Height, Weight, 128 BMI 22.7  HEENT; nontraumatic normocephalic, EOMI, PE R LA, sclera anicteric. Oropharynx; not examined today Neck; supple, no JVD Cardiovascular; regular rate and rhythm with S1-S2, no murmur rub or gallop Pulmonary; Clear bilaterally Abdomen; soft, nontender, nondistended, no palpable mass or hepatosplenomegaly, bowel sounds are active Rectal; not done today Skin; benign exam, no jaundice rash or appreciable lesions Extremities; no clubbing cyanosis or edema skin warm and dry Neuro/Psych; alert and oriented x4, grossly nonfocal mood and affect appropriate        Assessment & Plan:   #79 35 year old white female with history of chronic constipation, slow transit who comes in today for follow-up. Patient has had improvement in constipation with Linzess 290 mcg daily, still only having 1-2 bowel movements per week and uses as needed MiraLAX.  She is comfortable with this as she had much more significant constipation prior to starting Linzess and MiraLAX. 2.  Bloating and gas likely associated with constipation and  underlying IBS  Plan; We reviewed options with her insurance, Amitiza not covered and Trulance is a tier 3 co-pay so will continue with Linzess 290 mcg p.o. daily, new prescription sent refill x1 year She was given samples today as she says she will be unable to afford to get the medication over the next couple of weeks. She can continue to use MiraLAX 17 g in 8 ounces of water as needed and up to daily as needed We discussed increasing water intake to 60 to 80 ounces per day. She was given a copy of the low gas diet to review for any offender she is consuming on a regular basis.  Continue Gas-X as needed and also suggested a trial of Beano with larger meals. She will follow-up in 1 year or sooner as needed  Alfredia Ferguson PA-C 02/01/2021   Cc: Unk Pinto, MD

## 2021-02-01 NOTE — Patient Instructions (Signed)
If you are age 36 or younger, your body mass index should be between 19-25. Your Body mass index is 22.74 kg/m. If this is out of the aformentioned range listed, please consider follow up with your Primary Care Provider.  __________________________________________________________  The [facility] providers would like to encourage you to use [facility] to communicate with providers for non-urgent requests or questions.  Due to long hold times on the telephone, sending your provider a message by [facility] may be a faster and more efficient way to get a response.  Please allow 48 business hours for a response.  Please remember that this is for non-urgent requests.   Continue Linzess 290 mcg daily  Use Miralax 1 capful in 8 ounce of water or juice daily as needed.  Drink 60-80 ounces of water daily.  Follow a low gas diet.  Use Beano or gas-x as needed.  Follow up in 1 year or sooner if needed.  Thank you for entrusting me with your care and choosing [facility].  Amy Esterwood, PA-C

## 2021-02-02 NOTE — Progress Notes (Signed)
Agree with assessment and plan as outlined.  

## 2021-03-18 ENCOUNTER — Other Ambulatory Visit: Payer: Self-pay | Admitting: Physician Assistant

## 2023-04-10 LAB — OB RESULTS CONSOLE RPR: RPR: NONREACTIVE

## 2023-04-10 LAB — OB RESULTS CONSOLE HEPATITIS B SURFACE ANTIGEN: Hepatitis B Surface Ag: NEGATIVE

## 2023-04-10 LAB — OB RESULTS CONSOLE HIV ANTIBODY (ROUTINE TESTING): HIV: NONREACTIVE

## 2023-04-10 LAB — OB RESULTS CONSOLE RUBELLA ANTIBODY, IGM: Rubella: IMMUNE

## 2023-04-19 NOTE — L&D Delivery Note (Signed)
 Delivery Note At 5:39 PM a viable female was delivered via Vaginal, Spontaneous ROA  APGAR: 9, 9; weight pending  .   Placenta status: Spontaneous, Intact.  Cord: 3 vessels with the following complications: None.  Cord pH: not obtained   Anesthesia: Epidural Episiotomy: None Lacerations: 2nd degree;Periurethral Suture Repair: 3.0 chromic Est. Blood Loss (mL):  300  Mom to postpartum.  Baby to Couplet care / Skin to Skin.  [name] 11/07/2023, 5:47 PM

## 2023-04-25 LAB — OB RESULTS CONSOLE GC/CHLAMYDIA
Chlamydia: NEGATIVE
Neisseria Gonorrhea: NEGATIVE

## 2023-07-05 ENCOUNTER — Inpatient Hospital Stay (HOSPITAL_COMMUNITY)
Admission: AD | Admit: 2023-07-05 | Discharge: 2023-07-05 | Disposition: A | Discharge status: Home / Self Care | Attending: Obstetrics and Gynecology | Admitting: Obstetrics and Gynecology

## 2023-07-05 ENCOUNTER — Encounter (HOSPITAL_COMMUNITY): Payer: Self-pay

## 2023-07-05 DIAGNOSIS — R197 Diarrhea, unspecified: Secondary | ICD-10-CM | POA: Diagnosis present

## 2023-07-05 DIAGNOSIS — O09522 Supervision of elderly multigravida, second trimester: Secondary | ICD-10-CM | POA: Diagnosis not present

## 2023-07-05 DIAGNOSIS — O99612 Diseases of the digestive system complicating pregnancy, second trimester: Secondary | ICD-10-CM | POA: Diagnosis present

## 2023-07-05 DIAGNOSIS — A084 Viral intestinal infection, unspecified: Secondary | ICD-10-CM | POA: Insufficient documentation

## 2023-07-05 DIAGNOSIS — O99282 Endocrine, nutritional and metabolic diseases complicating pregnancy, second trimester: Secondary | ICD-10-CM | POA: Diagnosis not present

## 2023-07-05 DIAGNOSIS — E860 Dehydration: Secondary | ICD-10-CM | POA: Insufficient documentation

## 2023-07-05 DIAGNOSIS — O26892 Other specified pregnancy related conditions, second trimester: Secondary | ICD-10-CM

## 2023-07-05 DIAGNOSIS — R112 Nausea with vomiting, unspecified: Secondary | ICD-10-CM

## 2023-07-05 DIAGNOSIS — Z3A22 22 weeks gestation of pregnancy: Secondary | ICD-10-CM | POA: Insufficient documentation

## 2023-07-05 LAB — URINALYSIS, ROUTINE W REFLEX MICROSCOPIC
Bilirubin Urine: NEGATIVE
Glucose, UA: NEGATIVE mg/dL
Hgb urine dipstick: NEGATIVE
Ketones, ur: 80 mg/dL — AB
Nitrite: NEGATIVE
Protein, ur: NEGATIVE mg/dL
Specific Gravity, Urine: 1.027 (ref 1.005–1.030)
pH: 5 (ref 5.0–8.0)

## 2023-07-05 LAB — CBC WITH DIFFERENTIAL/PLATELET
Abs Immature Granulocytes: 0.03 10*3/uL (ref 0.00–0.07)
Basophils Absolute: 0 10*3/uL (ref 0.0–0.1)
Basophils Relative: 0 %
Eosinophils Absolute: 0 10*3/uL (ref 0.0–0.5)
Eosinophils Relative: 0 %
HCT: 32.1 % — ABNORMAL LOW (ref 36.0–46.0)
Hemoglobin: 11 g/dL — ABNORMAL LOW (ref 12.0–15.0)
Immature Granulocytes: 0 %
Lymphocytes Relative: 4 %
Lymphs Abs: 0.3 10*3/uL — ABNORMAL LOW (ref 0.7–4.0)
MCH: 30.9 pg (ref 26.0–34.0)
MCHC: 34.3 g/dL (ref 30.0–36.0)
MCV: 90.2 fL (ref 80.0–100.0)
Monocytes Absolute: 0.3 10*3/uL (ref 0.1–1.0)
Monocytes Relative: 4 %
Neutro Abs: 6.3 10*3/uL (ref 1.7–7.7)
Neutrophils Relative %: 92 %
Platelets: 150 10*3/uL (ref 150–400)
RBC: 3.56 MIL/uL — ABNORMAL LOW (ref 3.87–5.11)
RDW: 13 % (ref 11.5–15.5)
WBC: 6.9 10*3/uL (ref 4.0–10.5)
nRBC: 0 % (ref 0.0–0.2)

## 2023-07-05 MED ORDER — PROMETHAZINE HCL 12.5 MG PO TABS: 12.5000 mg | ORAL_TABLET | Freq: Four times a day (QID) | ORAL | 0 refills | Status: DC | PRN

## 2023-07-05 MED ORDER — LACTATED RINGERS IV BOLUS
1000.0000 mL | Freq: Once | INTRAVENOUS | Status: AC
  Administered 2023-07-05: 1000 mL via INTRAVENOUS

## 2023-07-05 MED ORDER — PROMETHAZINE HCL 25 MG PO TABS
25.0000 mg | ORAL_TABLET | Freq: Once | ORAL | Status: AC
  Administered 2023-07-05: 25 mg via ORAL
  Filled 2023-07-05: qty 1

## 2023-07-05 NOTE — MAU Provider Note (Signed)
 None     S Ms. EVOLETH NORDMEYER is a 38 y.o. [phone] pregnant female at [redacted]w[redacted]d who presents to MAU today with complaint of NVD. Has been throwing up every 30 minutes to 1 hour, since about 2200 last night. She reports she has had 1 episode of diarrhea, and that she is normally constipated. Reports she takes Miralax daily, and when not pregnant is on Linzess. Currently taking prenatal probiotic. No fever.   Does report she feels a bit short of breath. No issues with nausea for the past few weeks.   Receives care at [facility]. Prenatal records reviewed.  Pertinent items noted in HPI and remainder of comprehensive ROS otherwise negative.   O BP 105/65   Pulse (!) 112   Temp 98.6 F (37 C) (Oral)   Resp 16   Ht 5\' 3"  (1.6 m)   Wt 58.8 kg   SpO2 100%   BMI 22.96 kg/m  Physical Exam Vitals and nursing note reviewed.  Constitutional:      Appearance: Normal appearance.  HENT:     Head: Normocephalic.  Cardiovascular:     Rate and Rhythm: Normal rate and regular rhythm.     Pulses: Normal pulses.     Heart sounds: Normal heart sounds.  Pulmonary:     Effort: Pulmonary effort is normal.  Skin:    General: Skin is warm and dry.     Capillary Refill: Capillary refill takes less than 2 seconds.  Neurological:     Mental Status: She is alert and oriented to person, place, and time.  Psychiatric:        Mood and Affect: Mood normal.        Speech: Speech normal.        Behavior: Behavior normal.      MDM: PE Trial with hydration and antiemetic medications.  UA and CBC   MAU Course:  A Nausea and vomiting, unspecified vomiting type  Viral gastroenteritis  Medical screening exam complete Minor dehydration noted per ketones present in UA. Labs benign.   P Discharge from MAU in stable condition with routine precautions Follow up at [facility] as scheduled for ongoing prenatal care  Allergies as of 07/05/2023       Reactions   Hydrocodone Itching    Sudafed [pseudoephedrine Hcl] Palpitations        Medication List     STOP taking these medications    levonorgestrel 20 MCG/DAY Iud Commonly known as: MIRENA   Linzess 290 MCG Caps capsule Generic drug: linaclotide       TAKE these medications    acetaminophen 325 MG tablet Commonly known as: TYLENOL Take 650 mg by mouth every 6 (six) hours as needed.   acyclovir 400 MG tablet Commonly known as: ZOVIRAX acyclovir 400 mg tablet  TAKE 1 TABLET BY MOUTH TWICE DAILY   cabergoline 0.5 MG tablet Commonly known as: DOSTINEX Take 0.25 mg by mouth 2 (two) times a week.   MULTIVITAMIN WOMEN PO Take by mouth. Take one tablet daily   promethazine 12.5 MG tablet Commonly known as: PHENERGAN Take 1 tablet (12.5 mg total) by mouth every 6 (six) hours as needed for nausea or vomiting.        Lamont Snowball, MSN, CNM 07/05/2023 3:45 PM  Certified Nurse Midwife, [facility]

## 2023-07-05 NOTE — Discharge Instructions (Addendum)
 [place],  I am so sorry you are not feeling well. I recommend a bland diet, and electrolytes for hydration - like the Liquid IV you mentioned.   Thank you for trusting [place] to care for you, Huntley Dec, Midwife

## 2023-07-05 NOTE — MAU Note (Signed)
...  Joyce Chapman is a 38 y.o. at [redacted]w[redacted]d here in MAU reporting: N/V and HA since yesterday around 2200. She reports ongoing nausea and vomiting at least once an hour. One episode of diarrhea. No fever. Reports GI issues. Denies VB or LOF. +FM.  Onset of complaint: 2200 Pain score: 6/10 HA  Vitals:   07/05/23 1305  BP: (!) 116/59  Pulse: (!) 121  Resp: 16  Temp: 98.6 F (37 C)  SpO2: 100%     FHT: 160 doppler Lab orders placed from triage: UA

## 2023-10-16 LAB — OB RESULTS CONSOLE GBS: GBS: NEGATIVE

## 2023-11-01 NOTE — H&P (Signed)
 Joyce Chapman is a 38 y.o. G 3 P 2 at 39 weeks presents for IOL - elective, history of HSV on [place], GDM  - diet controlled. GBBS is negative  OB History     Gravida  3   Para  2   Term  2   Preterm      AB      Living  2      SAB      IAB      Ectopic      Multiple  0   Live Births  2          Past Medical History:  Diagnosis Date   Anxiety    IBS (irritable bowel syndrome)    Pituitary adenoma ([facility])    DX 2015   Prolactinoma ([facility])    Past Surgical History:  Procedure Laterality Date   NSVD     x2    TONSILLECTOMY AND ADENOIDECTOMY     WISDOM TOOTH EXTRACTION     Family History: family history includes Colon cancer in her paternal grandmother, paternal uncle, and paternal uncle; Diabetes in her maternal grandfather; Esophageal cancer in her maternal grandfather; Heart attack in her father; Heart disease in her father; Melanoma in her mother; Stomach cancer in her paternal grandmother; Throat cancer in her maternal grandfather; Transient ischemic attack in her mother. Social History:  reports that she quit smoking about 9 years ago. Her smoking use included cigarettes. She has never used smokeless tobacco. She reports that she does not currently use alcohol. She reports that she does not use drugs.     Maternal Diabetes: Yes:  Diabetes Type:  Diet controlled Genetic Screening: Normal Maternal Ultrasounds/Referrals: Normal Fetal Ultrasounds or other Referrals:  None Maternal Substance Abuse:  No Significant Maternal Medications:  None Significant Maternal Lab Results:  Group B Strep negative Number of Prenatal Visits:greater than 3 verified prenatal visits Maternal Vaccinations:TDap Other Comments:  None  Review of Systems  All other systems reviewed and are negative.  History   unknown if currently breastfeeding. Maternal Exam:  Abdomen: Fetal presentation: vertex   Physical Exam Vitals and nursing note reviewed. Exam conducted with a  chaperone present.  Constitutional:      Appearance: Normal appearance.  HENT:     Head: Normocephalic.     Right Ear: Tympanic membrane normal.  Cardiovascular:     Rate and Rhythm: Normal rate and regular rhythm.  Musculoskeletal:     Cervical back: Normal range of motion and neck supple.  Neurological:     Mental Status: She is alert.     Prenatal labs: ABO, Rh:   Antibody:   Rubella:   RPR:    HBsAg:    HIV:    GBS:     Assessment/Plan: IUP at term Elective IOL Admit for pitocoin induction  Joyce Chapman 11/01/2023, 3:06 PM

## 2023-11-07 ENCOUNTER — Inpatient Hospital Stay (HOSPITAL_COMMUNITY)
Admission: AD | Admit: 2023-11-07 | Discharge: 2023-11-08 | DRG: 806 | Disposition: A | Discharge status: Home / Self Care | Attending: Obstetrics and Gynecology | Admitting: Obstetrics and Gynecology

## 2023-11-07 ENCOUNTER — Inpatient Hospital Stay (HOSPITAL_COMMUNITY): Admitting: Anesthesiology

## 2023-11-07 ENCOUNTER — Inpatient Hospital Stay (HOSPITAL_COMMUNITY)

## 2023-11-07 ENCOUNTER — Encounter (HOSPITAL_COMMUNITY): Payer: Self-pay | Admitting: Obstetrics and Gynecology

## 2023-11-07 ENCOUNTER — Other Ambulatory Visit: Payer: Self-pay

## 2023-11-07 DIAGNOSIS — Z833 Family history of diabetes mellitus: Secondary | ICD-10-CM

## 2023-11-07 DIAGNOSIS — O26893 Other specified pregnancy related conditions, third trimester: Secondary | ICD-10-CM | POA: Diagnosis present

## 2023-11-07 DIAGNOSIS — O24420 Gestational diabetes mellitus in childbirth, diet controlled: Secondary | ICD-10-CM | POA: Diagnosis present

## 2023-11-07 DIAGNOSIS — Z8249 Family history of ischemic heart disease and other diseases of the circulatory system: Secondary | ICD-10-CM | POA: Diagnosis not present

## 2023-11-07 DIAGNOSIS — Z87891 Personal history of nicotine dependence: Secondary | ICD-10-CM

## 2023-11-07 DIAGNOSIS — O9832 Other infections with a predominantly sexual mode of transmission complicating childbirth: Secondary | ICD-10-CM | POA: Diagnosis present

## 2023-11-07 DIAGNOSIS — Z3A39 39 weeks gestation of pregnancy: Secondary | ICD-10-CM | POA: Diagnosis not present

## 2023-11-07 DIAGNOSIS — A6000 Herpesviral infection of urogenital system, unspecified: Secondary | ICD-10-CM | POA: Diagnosis present

## 2023-11-07 DIAGNOSIS — Z3490 Encounter for supervision of normal pregnancy, unspecified, unspecified trimester: Principal | ICD-10-CM

## 2023-11-07 LAB — CBC
HCT: 38.1 % (ref 36.0–46.0)
Hemoglobin: 12.4 g/dL (ref 12.0–15.0)
MCH: 31.2 pg (ref 26.0–34.0)
MCHC: 32.5 g/dL (ref 30.0–36.0)
MCV: 96 fL (ref 80.0–100.0)
Platelets: 149 K/uL — ABNORMAL LOW (ref 150–400)
RBC: 3.97 MIL/uL (ref 3.87–5.11)
RDW: 13.1 % (ref 11.5–15.5)
WBC: 10.2 K/uL (ref 4.0–10.5)
nRBC: 0 % (ref 0.0–0.2)

## 2023-11-07 LAB — TYPE AND SCREEN
ABO/RH(D): B POS
Antibody Screen: NEGATIVE

## 2023-11-07 LAB — GLUCOSE, CAPILLARY: Glucose-Capillary: 76 mg/dL (ref 70–99)

## 2023-11-07 MED ORDER — CLINDAMYCIN PHOSPHATE 900 MG/50ML IV SOLN: 900.0000 mg | Freq: Once | INTRAVENOUS | Status: DC

## 2023-11-07 MED ORDER — ACETAMINOPHEN 325 MG PO TABS
650.0000 mg | ORAL_TABLET | ORAL | Status: DC | PRN
Start: 2023-11-07 — End: 2023-11-07

## 2023-11-07 MED ORDER — EPHEDRINE 5 MG/ML INJ: 10.0000 mg | INTRAVENOUS | Status: DC | PRN

## 2023-11-07 MED ORDER — COCONUT OIL OIL
1.0000 | TOPICAL_OIL | Status: DC | PRN
  Administered 2023-11-08: 1 via TOPICAL

## 2023-11-07 MED ORDER — IBUPROFEN 600 MG PO TABS
600.0000 mg | ORAL_TABLET | Freq: Four times a day (QID) | ORAL | Status: DC
  Administered 2023-11-07 – 2023-11-08 (×3): 600 mg via ORAL
  Filled 2023-11-07 (×3): qty 1

## 2023-11-07 MED ORDER — TETANUS-DIPHTH-ACELL PERTUSSIS 5-2.5-18.5 LF-MCG/0.5 IM SUSY: 0.5000 mL | PREFILLED_SYRINGE | Freq: Once | INTRAMUSCULAR | Status: DC

## 2023-11-07 MED ORDER — LACTATED RINGERS IV SOLN: INTRAVENOUS | Status: DC

## 2023-11-07 MED ORDER — LACTATED RINGERS IV SOLN: 500.0000 mL | Freq: Once | INTRAVENOUS | Status: DC

## 2023-11-07 MED ORDER — ONDANSETRON HCL 4 MG/2ML IJ SOLN: 4.0000 mg | INTRAMUSCULAR | Status: DC | PRN

## 2023-11-07 MED ORDER — PHENYLEPHRINE 80 MCG/ML (10ML) SYRINGE FOR IV PUSH (FOR BLOOD PRESSURE SUPPORT): 80.0000 ug | PREFILLED_SYRINGE | INTRAVENOUS | Status: DC | PRN

## 2023-11-07 MED ORDER — OXYTOCIN BOLUS FROM INFUSION
333.0000 mL | Freq: Once | INTRAVENOUS | Status: AC
  Administered 2023-11-07: 333 mL via INTRAVENOUS

## 2023-11-07 MED ORDER — SENNOSIDES-DOCUSATE SODIUM 8.6-50 MG PO TABS: 2.0000 | ORAL_TABLET | Freq: Every day | ORAL | Status: DC

## 2023-11-07 MED ORDER — FLEET ENEMA RE ENEM: 1.0000 | ENEMA | Freq: Every day | RECTAL | Status: DC | PRN

## 2023-11-07 MED ORDER — OXYCODONE-ACETAMINOPHEN 5-325 MG PO TABS: 2.0000 | ORAL_TABLET | ORAL | Status: DC | PRN

## 2023-11-07 MED ORDER — LIDOCAINE HCL (PF) 1 % IJ SOLN: 30.0000 mL | INTRAMUSCULAR | Status: DC | PRN

## 2023-11-07 MED ORDER — OXYCODONE-ACETAMINOPHEN 5-325 MG PO TABS: 1.0000 | ORAL_TABLET | ORAL | Status: DC | PRN

## 2023-11-07 MED ORDER — DIPHENHYDRAMINE HCL 50 MG/ML IJ SOLN: 12.5000 mg | INTRAMUSCULAR | Status: DC | PRN

## 2023-11-07 MED ORDER — SOD CITRATE-CITRIC ACID 500-334 MG/5ML PO SOLN
30.0000 mL | ORAL | Status: DC | PRN
Start: 2023-11-07 — End: 2023-11-07

## 2023-11-07 MED ORDER — ONDANSETRON HCL 4 MG/2ML IJ SOLN: 4.0000 mg | Freq: Four times a day (QID) | INTRAMUSCULAR | Status: DC | PRN

## 2023-11-07 MED ORDER — SIMETHICONE 80 MG PO CHEW: 80.0000 mg | CHEWABLE_TABLET | ORAL | Status: DC | PRN

## 2023-11-07 MED ORDER — WITCH HAZEL-GLYCERIN EX PADS
1.0000 | MEDICATED_PAD | CUTANEOUS | Status: DC | PRN
Start: 2023-11-07 — End: 2023-11-09
  Administered 2023-11-07: 1 via TOPICAL

## 2023-11-07 MED ORDER — OXYTOCIN-SODIUM CHLORIDE 30-0.9 UT/500ML-% IV SOLN: 2.5000 [IU]/h | INTRAVENOUS | Status: DC

## 2023-11-07 MED ORDER — ZOLPIDEM TARTRATE 5 MG PO TABS
5.0000 mg | ORAL_TABLET | Freq: Every evening | ORAL | Status: DC | PRN
Start: 2023-11-07 — End: 2023-11-09

## 2023-11-07 MED ORDER — PRENATAL MULTIVITAMIN CH
1.0000 | ORAL_TABLET | Freq: Every day | ORAL | Status: DC
  Administered 2023-11-08: 1 via ORAL
  Filled 2023-11-07: qty 1

## 2023-11-07 MED ORDER — BISACODYL 10 MG RE SUPP: 10.0000 mg | Freq: Every day | RECTAL | Status: DC | PRN

## 2023-11-07 MED ORDER — FENTANYL-BUPIVACAINE-NACL 0.5-0.125-0.9 MG/250ML-% EP SOLN
12.0000 mL/h | EPIDURAL | Status: DC | PRN
  Filled 2023-11-07: qty 250

## 2023-11-07 MED ORDER — DIPHENHYDRAMINE HCL 25 MG PO CAPS: 25.0000 mg | ORAL_CAPSULE | Freq: Four times a day (QID) | ORAL | Status: DC | PRN

## 2023-11-07 MED ORDER — DIBUCAINE (PERIANAL) 1 % EX OINT
1.0000 | TOPICAL_OINTMENT | CUTANEOUS | Status: DC | PRN
Start: 2023-11-07 — End: 2023-11-09
  Administered 2023-11-07: 1 via RECTAL
  Filled 2023-11-07: qty 28

## 2023-11-07 MED ORDER — LACTATED RINGERS IV SOLN: 500.0000 mL | INTRAVENOUS | Status: DC | PRN

## 2023-11-07 MED ORDER — ONDANSETRON HCL 4 MG PO TABS: 4.0000 mg | ORAL_TABLET | ORAL | Status: DC | PRN

## 2023-11-07 MED ORDER — ACETAMINOPHEN 325 MG PO TABS
650.0000 mg | ORAL_TABLET | ORAL | Status: DC | PRN
  Administered 2023-11-07 – 2023-11-08 (×2): 650 mg via ORAL
  Filled 2023-11-07 (×2): qty 2

## 2023-11-07 MED ORDER — BENZOCAINE-MENTHOL 20-0.5 % EX AERO
1.0000 | INHALATION_SPRAY | CUTANEOUS | Status: DC | PRN
Start: 2023-11-07 — End: 2023-11-09
  Administered 2023-11-07: 1 via TOPICAL
  Filled 2023-11-07: qty 56

## 2023-11-07 MED ORDER — MEASLES, MUMPS & RUBELLA VAC IJ SOLR: 0.5000 mL | Freq: Once | INTRAMUSCULAR | Status: DC

## 2023-11-07 MED ORDER — OXYTOCIN-SODIUM CHLORIDE 30-0.9 UT/500ML-% IV SOLN
1.0000 m[IU]/min | INTRAVENOUS | Status: DC
  Administered 2023-11-07: 2 m[IU]/min via INTRAVENOUS
  Filled 2023-11-07: qty 500

## 2023-11-07 MED ORDER — MEDROXYPROGESTERONE ACETATE 150 MG/ML IM SUSP
150.0000 mg | INTRAMUSCULAR | Status: DC | PRN
Start: 2023-11-07 — End: 2023-11-09

## 2023-11-07 NOTE — Anesthesia Preprocedure Evaluation (Addendum)
 Anesthesia Evaluation  Patient identified by MRN, date of birth, ID band Patient awake    Reviewed: Allergy & Precautions, Patient's Chart, lab work & pertinent test results  Airway Mallampati: II  TM Distance: >3 FB Neck ROM: Full    Dental no notable dental hx. (+) Teeth Intact, Dental Advisory Given   Pulmonary former smoker   Pulmonary exam normal breath sounds clear to auscultation       Cardiovascular negative cardio ROS Normal cardiovascular exam Rhythm:Regular Rate:Normal     Neuro/Psych   Anxiety        GI/Hepatic negative GI ROS,,,  Endo/Other    Renal/GU      Musculoskeletal   Abdominal   Peds  Hematology Lab Results      Component                Value               Date                      WBC                      10.2                11/07/2023                HGB                      12.4                11/07/2023                HCT                      38.1                11/07/2023                MCV                      96.0                11/07/2023                PLT                      149 (L)             11/07/2023              Anesthesia Other Findings All: sudafed, hydrocodone  Reproductive/Obstetrics (+) Pregnancy                              Anesthesia Physical Anesthesia Plan  ASA: 3  Anesthesia Plan: Epidural   Post-op Pain Management:    Induction:   PONV Risk Score and Plan:   Airway Management Planned:   Additional Equipment: None  Intra-op Plan:   Post-operative Plan:   Informed Consent: I have reviewed the patients History and Physical, chart, labs and discussed the procedure including the risks, benefits and alternatives for the proposed anesthesia with the patient or authorized representative who has indicated his/her understanding and acceptance.       Plan Discussed with:   Anesthesia Plan Comments: (39.6 wk G3P2 for LEA)  Anesthesia Quick Evaluation

## 2023-11-07 NOTE — Progress Notes (Signed)
 Patient on pitocin  now. BP (!) 118/57   Pulse 75   Temp 98 F (36.7 C) (Oral)   Resp 16   Ht 5' 3 (1.6 m)   Wt 62.6 kg   BMI 24.43 kg/m  Results for orders placed or performed during the hospital encounter of 11/07/23 (from the past 24 hours)  CBC     Status: Abnormal   Collection Time: 11/07/23  1:29 PM  Result Value Ref Range   WBC 10.2 4.0 - 10.5 K/uL   RBC 3.97 3.87 - 5.11 MIL/uL   Hemoglobin 12.4 12.0 - 15.0 g/dL   HCT 61.8 63.9 - 53.9 %   MCV 96.0 80.0 - 100.0 fL   MCH 31.2 26.0 - 34.0 pg   MCHC 32.5 30.0 - 36.0 g/dL   RDW 86.8 88.4 - 84.4 %   Platelets 149 (L) 150 - 400 K/uL   nRBC 0.0 0.0 - 0.2 %  Type and screen [facility]     Status: None   Collection Time: 11/07/23  1:29 PM  Result Value Ref Range   ABO/RH(D) B POS    Antibody Screen NEG    Sample Expiration      11/10/2023,2359 Performed at [facility], [address] [place], [place] [address]   Glucose, capillary     Status: None   Collection Time: 11/07/23  2:10 PM  Result Value Ref Range   Glucose-Capillary 76 70 - 99 mg/dL  FHR category 1 Cervix is 80% 5.5 / -2 Vertex AROM CLEAR FLUID  IMPRESSION: IUP at term GDM PLAN: Continue Pitocin  Epidural now Anticipate NSVD

## 2023-11-07 NOTE — Lactation Note (Signed)
 This note was copied from a baby's chart. Lactation Consultation Note  Patient Name: Boy Tarrah Furuta Unijb'd Date: 11/07/2023 Age:39 hours Reason for consult: Initial assessment;Term Experience BF mom stated the baby has been BF great. Mom doesn't have any questions or concerns at this time. Newborn feeding habits, I&O, body alignment reviewed. Mom encouraged to feed baby 8-12 times/24 hours and with feeding cues.  Encouraged mom to call if she has questions or concerns. Maternal Data Does the patient have breastfeeding experience prior to this delivery?: Yes  Feeding    LATCH Score Latch: Grasps breast easily, tongue down, lips flanged, rhythmical sucking.  Audible Swallowing: A few with stimulation  Type of Nipple: Everted at rest and after stimulation  Comfort (Breast/Nipple): Soft / non-tender  Hold (Positioning): No assistance needed to correctly position infant at breast.  LATCH Score: 9   Lactation Tools Discussed/Used    Interventions Interventions: Breast feeding basics reviewed;Education;LC Services brochure;CDC milk storage guidelines  Discharge    Consult Status Consult Status: Complete    Ceceilia Cephus G 11/07/2023, 10:14 PM

## 2023-11-07 NOTE — Anesthesia Procedure Notes (Signed)
 Epidural Patient location during procedure: OB Start time: 11/07/2023 4:45 PM End time: 11/07/2023 4:56 PM  Staffing Anesthesiologist: Jefm Garnette LABOR, MD Performed: anesthesiologist   Preanesthetic Checklist Completed: patient identified, IV checked, site marked, risks and benefits discussed, surgical consent, monitors and equipment checked, pre-op evaluation and timeout performed  Epidural Patient position: sitting Prep: DuraPrep and site prepped and draped Patient monitoring: continuous pulse ox and blood pressure Approach: midline Location: L2-L3 Injection technique: LOR air  Needle:  Needle type: Tuohy  Needle gauge: 17 G Needle length: 9 cm and 9 Needle insertion depth: 7 cm Catheter type: closed end flexible Catheter size: 19 Gauge Catheter at skin depth: 12 cm Test dose: negative  Assessment Events: blood not aspirated, no cerebrospinal fluid, injection not painful, no injection resistance, no paresthesia and negative IV test  Additional Notes Patient identified. Risks/Benefits/Options discussed with patient including but not limited to bleeding, infection, nerve damage, paralysis, failed block, incomplete pain control, headache, blood pressure changes, nausea, vomiting, reactions to medication both or allergic, itching and postpartum back pain. Confirmed with bedside nurse the patient's most recent platelet count. Confirmed with patient that they are not currently taking any anticoagulation, have any bleeding history or any family history of bleeding disorders. Patient expressed understanding and wished to proceed. All questions were answered. Sterile technique was used throughout the entire procedure. Please see nursing notes for vital signs. Test dose was given through epidural needle and negative prior to continuing to dose epidural or start infusion. Warning signs of high block given to the patient including shortness of breath, tingling/numbness in hands, complete  motor block, or any concerning symptoms with instructions to call for help. Patient was given instructions on fall risk and not to get out of bed. All questions and concerns addressed with instructions to call with any issues.  1 Attempt (S) . Patient tolerated procedure well.

## 2023-11-08 LAB — CBC
HCT: 32 % — ABNORMAL LOW (ref 36.0–46.0)
Hemoglobin: 11 g/dL — ABNORMAL LOW (ref 12.0–15.0)
MCH: 31.6 pg (ref 26.0–34.0)
MCHC: 34.4 g/dL (ref 30.0–36.0)
MCV: 92 fL (ref 80.0–100.0)
Platelets: 139 K/uL — ABNORMAL LOW (ref 150–400)
RBC: 3.48 MIL/uL — ABNORMAL LOW (ref 3.87–5.11)
RDW: 13.2 % (ref 11.5–15.5)
WBC: 12.9 K/uL — ABNORMAL HIGH (ref 4.0–10.5)
nRBC: 0 % (ref 0.0–0.2)

## 2023-11-08 MED ORDER — ACETAMINOPHEN 325 MG PO TABS
650.0000 mg | ORAL_TABLET | Freq: Four times a day (QID) | ORAL | Status: DC
  Administered 2023-11-08 (×2): 650 mg via ORAL
  Filled 2023-11-08 (×2): qty 2

## 2023-11-08 MED ORDER — SENNOSIDES-DOCUSATE SODIUM 8.6-50 MG PO TABS: 2.0000 | ORAL_TABLET | Freq: Every day | ORAL | 1 refills | Status: DC

## 2023-11-08 MED ORDER — METHOCARBAMOL 750 MG PO TABS
750.0000 mg | ORAL_TABLET | Freq: Three times a day (TID) | ORAL | Status: DC | PRN
  Administered 2023-11-08: 750 mg via ORAL
  Filled 2023-11-08 (×2): qty 1

## 2023-11-08 MED ORDER — OXYCODONE HCL 5 MG PO TABS
5.0000 mg | ORAL_TABLET | Freq: Once | ORAL | Status: AC
  Administered 2023-11-08: 5 mg via ORAL
  Filled 2023-11-08: qty 1

## 2023-11-08 MED ORDER — IBUPROFEN 600 MG PO TABS: 600.0000 mg | ORAL_TABLET | Freq: Four times a day (QID) | ORAL | 0 refills | Status: AC

## 2023-11-08 MED ORDER — ACETAMINOPHEN 325 MG PO TABS: 650.0000 mg | ORAL_TABLET | Freq: Four times a day (QID) | ORAL | 1 refills | Status: AC

## 2023-11-08 NOTE — Social Work (Signed)
 MOB was referred for history of depression/anxiety.  * Referral screened out by Clinical Social Worker because none of the following criteria appear to apply:  ~ History of anxiety/depression during this pregnancy, or of post-partum depression following prior delivery.  ~ Diagnosis of anxiety and/or depression within last 3 years OR * MOB's symptoms currently being treated with medication and/or therapy.  Per chart review MOB diagnosis dates back to 2018, per OB records no MH concerns noted during this pregnancy. Edinburgh=0.   Please contact the Clinical Social Worker if needs arise, by MOB request.   Eliazar Gave, LCSWA Clinical Social Worker [phone]

## 2023-11-08 NOTE — Progress Notes (Signed)
 Postpartum Progress Note  S: Feeling well overall, although abdominal cramping was intense overnight. Lochia appropriate. No subjective fevers/chills.  O:     11/08/2023    4:45 AM 11/08/2023   12:30 AM 11/07/2023    9:33 PM  Vitals with BMI  Systolic 114 104 896  Diastolic 45 63 60  Pulse 60 66 75    Gen: NAD, A&O Pulm: NWOB Abd: soft, appropriately ttp, fundus firm and below Umb Ext: No evidence of DVT, trace edema b/l  Labs Recent Results (from the past 2160 hours)  OB RESULT CONSOLE Group B Strep     Status: None   Collection Time: 10/16/23 12:00 AM  Result Value Ref Range   GBS Negative   CBC     Status: Abnormal   Collection Time: 11/07/23  1:29 PM  Result Value Ref Range   WBC 10.2 4.0 - 10.5 K/uL   RBC 3.97 3.87 - 5.11 MIL/uL   Hemoglobin 12.4 12.0 - 15.0 g/dL   HCT 61.8 63.9 - 53.9 %   MCV 96.0 80.0 - 100.0 fL   MCH 31.2 26.0 - 34.0 pg   MCHC 32.5 30.0 - 36.0 g/dL   RDW 86.8 88.4 - 84.4 %   Platelets 149 (L) 150 - 400 K/uL   nRBC 0.0 0.0 - 0.2 %    Comment: Performed at [facility], [address] [place], [place] [address]  Type and screen [facility]     Status: None   Collection Time: 11/07/23  1:29 PM  Result Value Ref Range   ABO/RH(D) B POS    Antibody Screen NEG    Sample Expiration      11/10/2023,2359 Performed at [facility], [address] [place], [place] [address]   Glucose, capillary     Status: None   Collection Time: 11/07/23  2:10 PM  Result Value Ref Range   Glucose-Capillary 76 70 - 99 mg/dL    Comment: Glucose reference range applies only to samples taken after fasting for at least 8 hours.  CBC     Status: Abnormal   Collection Time: 11/08/23  6:16 AM  Result Value Ref Range   WBC 12.9 (H) 4.0 - 10.5 K/uL   RBC 3.48 (L) 3.87 - 5.11 MIL/uL   Hemoglobin 11.0 (L) 12.0 - 15.0 g/dL   HCT 67.9 (L) 63.9 - 53.9 %   MCV 92.0 80.0 - 100.0 fL   MCH 31.6 26.0 - 34.0 pg   MCHC 34.4 30.0 - 36.0 g/dL   RDW  86.7 88.4 - 84.4 %   Platelets 139 (L) 150 - 400 K/uL   nRBC 0.0 0.0 - 0.2 %    Comment: Performed at [facility], [address], [place], [place] [address]     A/P:  PPD1 s/p SVD, doing well pp. AFVSS. Benign exam.  Desire circ - has not yet been examined by peds. Will perform when cleared. Has robaxin  ordered for cramping which has helped in addition to scheduled ibuprofen , will change tylenol  to scheduled. Plan for dc tomorrow.  Slater Door, MD

## 2023-11-08 NOTE — Progress Notes (Signed)
 Updated that patient desires 24hour discharge and peds has cleared baby. Will perform circ and place dc orders.  Slater Door, MD

## 2023-11-08 NOTE — Anesthesia Postprocedure Evaluation (Signed)
 Anesthesia Post Note  Patient: Joyce Chapman  Procedure(s) Performed: AN AD HOC LABOR EPIDURAL     Patient location during evaluation: Mother Baby Anesthesia Type: Epidural Level of consciousness: awake and alert and oriented Pain management: satisfactory to patient Vital Signs Assessment: post-procedure vital signs reviewed and stable Respiratory status: respiratory function stable Cardiovascular status: stable Postop Assessment: no headache, no backache, epidural receding, patient able to bend at knees, no signs of nausea or vomiting, adequate PO intake and able to ambulate Anesthetic complications: no   No notable events documented.  Last Vitals:  Vitals:   11/08/23 0030 11/08/23 0445  BP: 104/63 (!) 114/45  Pulse: 66 60  Resp: 18 18  Temp: 36.6 C 36.6 C  SpO2: 100% 100%    Last Pain:  Vitals:   11/08/23 0804  TempSrc:   PainSc: 5    Pain Goal:                   Artyom Stencel

## 2023-11-08 NOTE — Discharge Summary (Signed)
 Postpartum Discharge Summary  Date of Service updated 11/08/2023     Patient Name: Joyce Chapman DOB: 1985/05/22 MRN: [id number]  Date of admission: 11/07/2023 Delivery date:11/07/2023 Delivering provider: MAT BROWNING Date of discharge: 11/08/2023  Admitting diagnosis: Pregnancy [Z34.90] NSVD (normal spontaneous vaginal delivery) [O80] Intrauterine pregnancy: [redacted]w[redacted]d     Secondary diagnosis:  Principal Problem:   Pregnancy Active Problems:   NSVD (normal spontaneous vaginal delivery)  Additional problems: History of HSV, GDMA1    Discharge diagnosis: Term Pregnancy Delivered and GDM A1                                              Post partum procedures:none Augmentation: AROM and Pitocin  Complications: None  Hospital course: Induction of Labor With Vaginal Delivery   38 y.o. yo G3P3003 at [redacted]w[redacted]d was admitted to the hospital 11/07/2023 for induction of labor.  Indication for induction: A1 DM.  Patient had an labor course complicated by none Membrane Rupture Time/Date: 4:15 PM,11/07/2023  Delivery Method:Vaginal, Spontaneous Episiotomy: None Lacerations:  2nd degree;Periurethral;Perineal Details of delivery can be found in separate delivery note.  Patient had a postpartum course complicated by none. Patient is discharged home 11/08/23.  Newborn Data: Birth date:11/07/2023 Birth time:5:39 PM Gender:Female Living status:Living Apgars:9 ,9  Weight:3170 g  Magnesium Sulfate received: No BMZ received: No Rhophylac:N/A MMR:N/A T-DaP:Given prenatally Transfusion:No Immunizations administered: There is no immunization history for the selected administration types on file for this patient.  Physical exam  Vitals:   11/07/23 2133 11/08/23 0030 11/08/23 0445 11/08/23 1208  BP: 103/60 104/63 (!) 114/45 109/66  Pulse: 75 66 60 66  Resp: 18 18 18 17   Temp: 98 F (36.7 C) 97.9 F (36.6 C) 97.8 F (36.6 C) (!) 97.3 F (36.3 C)  TempSrc: Oral Oral Oral Axillary  SpO2: 100%  100% 100% 98%  Weight:      Height:       General: alert, cooperative, and no distress Lochia: appropriate Uterine Fundus: firm Incision: N/A DVT Evaluation: No evidence of DVT seen on physical exam. Labs: Lab Results  Component Value Date   WBC 12.9 (H) 11/08/2023   HGB 11.0 (L) 11/08/2023   HCT 32.0 (L) 11/08/2023   MCV 92.0 11/08/2023   PLT 139 (L) 11/08/2023      Latest Ref Rng & Units 01/01/2019    2:01 PM  CMP  Total Protein 6.0 - 8.3 g/dL 6.9   Total Bilirubin 0.2 - 1.2 mg/dL 0.4   Alkaline Phos 39 - 117 U/L 54   AST 0 - 37 U/L 17   ALT 0 - 35 U/L 16    Edinburgh Score:    11/08/2023   11:10 AM  Edinburgh Postnatal Depression Scale Screening Tool  I have been able to laugh and see the funny side of things. 0  I have looked forward with enjoyment to things. 0  I have blamed myself unnecessarily when things went wrong. 0  I have been anxious or worried for no good reason. 0  I have felt scared or panicky for no good reason. 0  Things have been getting on top of me. 0  I have been so unhappy that I have had difficulty sleeping. 0  I have felt sad or miserable. 0  I have been so unhappy that I have been crying. 0  The thought of  harming myself has occurred to me. 0  Edinburgh Postnatal Depression Scale Total 0      After visit meds:  Allergies as of 11/08/2023       Reactions   Hydrocodone Itching   Sudafed [pseudoephedrine Hcl] Palpitations        Medication List     TAKE these medications    acetaminophen  325 MG tablet Commonly known as: TYLENOL  Take 650 mg by mouth every 6 (six) hours as needed. What changed: Another medication with the same name was added. Make sure you understand how and when to take each.   acetaminophen  325 MG tablet Commonly known as: Tylenol  Take 2 tablets (650 mg total) by mouth every 6 (six) hours. What changed: You were already taking a medication with the same name, and this prescription was added. Make sure you  understand how and when to take each.   acyclovir 400 MG tablet Commonly known as: ZOVIRAX acyclovir 400 mg tablet  TAKE 1 TABLET BY MOUTH TWICE DAILY   cabergoline  0.5 MG tablet Commonly known as: DOSTINEX  Take 0.25 mg by mouth 2 (two) times a week.   ibuprofen  600 MG tablet Commonly known as: ADVIL  Take 1 tablet (600 mg total) by mouth every 6 (six) hours.   MULTIVITAMIN WOMEN PO Take by mouth. Take one tablet daily   promethazine  12.5 MG tablet Commonly known as: PHENERGAN  Take 1 tablet (12.5 mg total) by mouth every 6 (six) hours as needed for nausea or vomiting.   senna-docusate 8.6-50 MG tablet Commonly known as: Senokot-S Take 2 tablets by mouth daily. Start taking on: November 09, 2023         Discharge home in stable condition Infant Feeding: Bottle and Breast Infant Disposition:home with mother Discharge instruction: per After Visit Summary and Postpartum booklet. Activity: Advance as tolerated. Pelvic rest for 6 weeks.  Diet: routine diet Anticipated Birth Control: Unsure Postpartum Appointment:6 weeks   11/08/2023 Slater JINNY Door, MD

## 2023-11-09 LAB — RPR: RPR Ser Ql: NONREACTIVE

## 2023-11-15 ENCOUNTER — Inpatient Hospital Stay (HOSPITAL_COMMUNITY)

## 2023-11-23 ENCOUNTER — Telehealth (HOSPITAL_COMMUNITY): Payer: Self-pay | Admitting: *Deleted

## 2023-11-23 NOTE — Telephone Encounter (Signed)
 11/23/2023  Name: CATHERYNE DEFORD MRN: [id number] DOB: 06/27/1985  Reason for Call:  Transition of Care Hospital Discharge Call  Contact Status: Patient Contact Status: Complete  Language assistant needed:          Follow-Up Questions: Do You Have Any Concerns About Your Health As You Heal From Delivery?: No Do You Have Any Concerns About Your Infants Health?: No  Edinburgh Postnatal Depression Scale:  In the Past 7 Days:   Patient reported completing EPDS at a pediatric appointment. No concerns identified at that time, per patient report. Patient declined EPDS at this time. PHQ2-9 Depression Scale:     Discharge Follow-up: Edinburgh score requires follow up?: N/A Patient was advised of the following resources:: Breastfeeding Support Group, Support Group Declined email information at this time.   Post-discharge interventions: Reviewed Newborn Safe Sleep Practices  Signature Allean IVAR Carton, RN, 11/23/23, [phone]

## 2024-01-03 ENCOUNTER — Ambulatory Visit: Payer: Self-pay | Admitting: General Surgery

## 2024-01-03 NOTE — Progress Notes (Signed)
 REFERRING PHYSICIAN:  Dannielle Bouchard, DO PROVIDER:  DEWARD GARNETTE NULL, MD MRN: [id number] DOB: 15-Jul-1985 DATE OF ENCOUNTER: 01/03/2024 Subjective    Chief Complaint: New Consultation   History of Present Illness: Joyce Chapman is a 38 y.o. female who is seen today as an office consultation for evaluation of New Consultation   We are asked to see the patient in consultation by Dr. Bouchard Dannielle to evaluate her for an umbilical hernia.  The patient is a 38 y white female who noticed a bulge at her umbilicus during her second pregnancy.  She just delivered her third child and continues to notice a bulge at the umbilicus.  She does have some occasional significant discomfort associated with it.  She denies any nausea or vomiting.  Her appetite seems good and her bowels are working normally.    Review of Systems: A complete review of systems was obtained from the patient.  I have reviewed this information and discussed as appropriate with the patient.  See HPI as well for other ROS.  ROS   Medical History: Past Medical History:  Diagnosis Date  . Anxiety     Patient Active Problem List  Diagnosis  . Umbilical hernia without obstruction or gangrene    History reviewed. No pertinent surgical history.   No Known Allergies  Current Outpatient Medications on File Prior to Visit  Medication Sig Dispense Refill  . cholecalciferol 1000 unit tablet     . prenatal 95-iron fum-folic-dha (PRENATAL + DHA) 28 mg iron-800 mcg-200 mg Cmpk      No current facility-administered medications on file prior to visit.    Family History  Problem Relation Age of Onset  . High blood pressure (Hypertension) Mother   . Diabetes Mother   . Stroke Father   . Myocardial Infarction (Heart attack) Father      Social History   Tobacco Use  Smoking Status Former  . Types: Cigarettes  Smokeless Tobacco Never     Social History   Socioeconomic History  . Marital status: Single  Tobacco  Use  . Smoking status: Former    Types: Cigarettes  . Smokeless tobacco: Never  Vaping Use  . Vaping status: Never Used  Substance and Sexual Activity  . Alcohol use: Never  . Drug use: Never   Social Drivers of Corporate Investment Banker Strain: Low Risk  (09/15/2022)   Received from [facility]   Overall Financial Resource Strain (CARDIA)   . Difficulty of Paying Living Expenses: Not very hard  Food Insecurity: No Food Insecurity (11/07/2023)   Received from [facility]   Hunger Vital Sign   . Within the past 12 months, you worried that your food would run out before you got the money to buy more.: Never true   . Within the past 12 months, the food you bought just didn't last and you didn't have money to get more.: Never true  Transportation Needs: No Transportation Needs (11/07/2023)   Received from [facility] - Transportation   . In the past 12 months, has lack of transportation kept you from medical appointments or from getting medications?: No   . In the past 12 months, has lack of transportation kept you from meetings, work, or from getting things needed for daily living?: No  Physical Activity: Insufficiently Active (09/15/2022)   Received from [facility]   Exercise Vital Sign   . On average, how many days per week do you engage in moderate  to strenuous exercise (like a brisk walk)?: 4 days   . On average, how many minutes do you engage in exercise at this level?: 30 min  Stress: No Stress Concern Present (09/15/2022)   Received from [facility] - Occupational Stress Questionnaire   . Feeling of Stress : Only a little  Social Connections: Moderately Integrated (09/15/2022)   Received from [facility]   Social Network   . How would you rate your social network (family, work, friends)?: Adequate participation with social networks  Housing Stability: Unknown (01/03/2024)   Housing Stability Vital Sign   . Homeless  in the Last Year: No    Objective:   Vitals:   01/03/24 1027 01/03/24 1028  BP: 132/71   Pulse: 76   Temp: 36.6 C (97.9 F)   SpO2: 99%   Weight: 56.5 kg (124 lb 9.6 oz)   Height: 160 cm (5' 3)   PainSc:  0-No pain    Body mass index is 22.07 kg/m.  Physical Exam Vitals reviewed.  Constitutional:      General: She is not in acute distress.    Appearance: Normal appearance.  HENT:     Head: Normocephalic and atraumatic.     Right Ear: External ear normal.     Left Ear: External ear normal.     Nose: Nose normal.     Mouth/Throat:     Mouth: Mucous membranes are moist.     Pharynx: Oropharynx is clear.  Eyes:     General: No scleral icterus.    Extraocular Movements: Extraocular movements intact.     Conjunctiva/sclera: Conjunctivae normal.     Pupils: Pupils are equal, round, and reactive to light.  Cardiovascular:     Rate and Rhythm: Normal rate and regular rhythm.     Pulses: Normal pulses.     Heart sounds: Normal heart sounds.  Pulmonary:     Effort: Pulmonary effort is normal. No respiratory distress.     Breath sounds: Normal breath sounds.  Abdominal:     General: Bowel sounds are normal.     Palpations: Abdomen is soft.     Tenderness: There is no abdominal tenderness.     Comments: There is a small reducible bulge at the umbilicus.  The fascial defect appears to be about a centimeter.  Musculoskeletal:        General: No swelling, tenderness or deformity. Normal range of motion.     Cervical back: Normal range of motion and neck supple.  Skin:    General: Skin is warm and dry.     Coloration: Skin is not jaundiced.  Neurological:     General: No focal deficit present.     Mental Status: She is alert and oriented to person, place, and time.  Psychiatric:        Mood and Affect: Mood normal.        Behavior: Behavior normal.        Labs, Imaging and Diagnostic Testing:   Assessment and Plan:     Diagnoses and all orders for this  visit:  Umbilical hernia without obstruction or gangrene    The patient appears to have a small but symptomatic umbilical hernia.  Because of the risk of incarceration and strangulation I feel she would benefit from having this fixed.  She would also like to have this done.  I have discussed with her in detail the risks and benefits of the operation as  well as some of the technical aspects including the possible use of mesh and the risk of chronic pain and she understands and wishes to proceed.  We will move forward with surgical scheduling    DEWARD GARNETTE NULL, MD    I spent a total of 30 minutes in both face-to-face and non-face-to-face activities, excluding procedures performed, for this visit on the date of this encounter.

## 2024-02-13 ENCOUNTER — Other Ambulatory Visit: Payer: Self-pay

## 2024-02-13 ENCOUNTER — Encounter (HOSPITAL_BASED_OUTPATIENT_CLINIC_OR_DEPARTMENT_OTHER): Payer: Self-pay | Admitting: General Surgery

## 2024-02-23 ENCOUNTER — Ambulatory Visit (HOSPITAL_BASED_OUTPATIENT_CLINIC_OR_DEPARTMENT_OTHER)
Admission: RE | Admit: 2024-02-23 | Discharge: 2024-02-23 | Disposition: A | Discharge status: Home / Self Care | Attending: General Surgery | Admitting: General Surgery

## 2024-02-23 ENCOUNTER — Ambulatory Visit (HOSPITAL_BASED_OUTPATIENT_CLINIC_OR_DEPARTMENT_OTHER): Admitting: Certified Registered"

## 2024-02-23 ENCOUNTER — Other Ambulatory Visit: Payer: Self-pay

## 2024-02-23 ENCOUNTER — Encounter (HOSPITAL_BASED_OUTPATIENT_CLINIC_OR_DEPARTMENT_OTHER)
Admission: RE | Disposition: A | Discharge status: Home / Self Care | Payer: Self-pay | Source: Home / Self Care | Attending: General Surgery

## 2024-02-23 ENCOUNTER — Encounter (HOSPITAL_BASED_OUTPATIENT_CLINIC_OR_DEPARTMENT_OTHER): Payer: Self-pay | Admitting: General Surgery

## 2024-02-23 DIAGNOSIS — Z79899 Other long term (current) drug therapy: Secondary | ICD-10-CM | POA: Diagnosis not present

## 2024-02-23 DIAGNOSIS — Z01818 Encounter for other preprocedural examination: Secondary | ICD-10-CM

## 2024-02-23 DIAGNOSIS — F419 Anxiety disorder, unspecified: Secondary | ICD-10-CM | POA: Insufficient documentation

## 2024-02-23 DIAGNOSIS — K429 Umbilical hernia without obstruction or gangrene: Secondary | ICD-10-CM

## 2024-02-23 DIAGNOSIS — Z87891 Personal history of nicotine dependence: Secondary | ICD-10-CM | POA: Insufficient documentation

## 2024-02-23 HISTORY — PX: UMBILICAL HERNIA REPAIR: SHX196

## 2024-02-23 LAB — POCT PREGNANCY, URINE: Preg Test, Ur: NEGATIVE

## 2024-02-23 SURGERY — REPAIR, HERNIA, UMBILICAL, ADULT
Anesthesia: General

## 2024-02-23 MED ORDER — SUGAMMADEX SODIUM 200 MG/2ML IV SOLN
INTRAVENOUS | Status: DC | PRN
  Administered 2024-02-23: 200 mg via INTRAVENOUS

## 2024-02-23 MED ORDER — CEFAZOLIN SODIUM-DEXTROSE 2-4 GM/100ML-% IV SOLN
2.0000 g | INTRAVENOUS | Status: AC
  Administered 2024-02-23: 2 g via INTRAVENOUS

## 2024-02-23 MED ORDER — OXYCODONE HCL 5 MG/5ML PO SOLN: 5.0000 mg | Freq: Once | ORAL | Status: AC | PRN

## 2024-02-23 MED ORDER — FENTANYL CITRATE (PF) 100 MCG/2ML IJ SOLN
INTRAMUSCULAR | Status: DC | PRN
  Administered 2024-02-23: 100 ug via INTRAVENOUS

## 2024-02-23 MED ORDER — 0.9 % SODIUM CHLORIDE (POUR BTL) OPTIME
TOPICAL | Status: DC | PRN
  Administered 2024-02-23: 1000 mL

## 2024-02-23 MED ORDER — CEFAZOLIN SODIUM-DEXTROSE 2-4 GM/100ML-% IV SOLN
INTRAVENOUS | Status: AC
  Filled 2024-02-23: qty 100

## 2024-02-23 MED ORDER — PROPOFOL 10 MG/ML IV BOLUS
INTRAVENOUS | Status: DC | PRN
  Administered 2024-02-23: 100 mg via INTRAVENOUS

## 2024-02-23 MED ORDER — OXYCODONE HCL 5 MG PO TABS
5.0000 mg | ORAL_TABLET | Freq: Once | ORAL | Status: AC | PRN
  Administered 2024-02-23: 5 mg via ORAL

## 2024-02-23 MED ORDER — ONDANSETRON HCL 4 MG/2ML IJ SOLN
INTRAMUSCULAR | Status: AC
  Filled 2024-02-23: qty 2

## 2024-02-23 MED ORDER — FENTANYL CITRATE (PF) 100 MCG/2ML IJ SOLN
INTRAMUSCULAR | Status: AC
  Filled 2024-02-23: qty 2

## 2024-02-23 MED ORDER — GABAPENTIN 100 MG PO CAPS: 100.0000 mg | ORAL_CAPSULE | ORAL | Status: DC

## 2024-02-23 MED ORDER — FENTANYL CITRATE (PF) 100 MCG/2ML IJ SOLN
25.0000 ug | INTRAMUSCULAR | Status: DC | PRN
  Administered 2024-02-23 (×2): 50 ug via INTRAVENOUS

## 2024-02-23 MED ORDER — LIDOCAINE 2% (20 MG/ML) 5 ML SYRINGE
INTRAMUSCULAR | Status: DC | PRN
Start: 2024-02-23 — End: 2024-02-23
  Administered 2024-02-23: 100 mg via INTRAVENOUS

## 2024-02-23 MED ORDER — CHLORHEXIDINE GLUCONATE CLOTH 2 % EX PADS: 6.0000 | MEDICATED_PAD | Freq: Once | CUTANEOUS | Status: DC

## 2024-02-23 MED ORDER — OXYCODONE HCL 5 MG PO TABS
ORAL_TABLET | ORAL | Status: AC
  Filled 2024-02-23: qty 1

## 2024-02-23 MED ORDER — BUPIVACAINE HCL (PF) 0.25 % IJ SOLN
INTRAMUSCULAR | Status: DC | PRN
  Administered 2024-02-23: 20 mL

## 2024-02-23 MED ORDER — ACETAMINOPHEN 10 MG/ML IV SOLN: 1000.0000 mg | Freq: Once | INTRAVENOUS | Status: DC | PRN

## 2024-02-23 MED ORDER — ONDANSETRON HCL 4 MG/2ML IJ SOLN
INTRAMUSCULAR | Status: DC | PRN
Start: 2024-02-23 — End: 2024-02-23
  Administered 2024-02-23: 4 mg via INTRAVENOUS

## 2024-02-23 MED ORDER — LACTATED RINGERS IV SOLN: INTRAVENOUS | Status: DC

## 2024-02-23 MED ORDER — OXYCODONE HCL 5 MG PO TABS: 5.0000 mg | ORAL_TABLET | Freq: Four times a day (QID) | ORAL | 0 refills | Status: AC | PRN

## 2024-02-23 MED ORDER — ONDANSETRON HCL 4 MG/2ML IJ SOLN: 4.0000 mg | Freq: Once | INTRAMUSCULAR | Status: DC | PRN

## 2024-02-23 MED ORDER — ROCURONIUM BROMIDE 100 MG/10ML IV SOLN
INTRAVENOUS | Status: DC | PRN
  Administered 2024-02-23: 50 mg via INTRAVENOUS

## 2024-02-23 MED ORDER — ROCURONIUM BROMIDE 10 MG/ML (PF) SYRINGE
PREFILLED_SYRINGE | INTRAVENOUS | Status: AC
  Filled 2024-02-23: qty 10

## 2024-02-23 MED ORDER — KETOROLAC TROMETHAMINE 30 MG/ML IJ SOLN
INTRAMUSCULAR | Status: AC
  Filled 2024-02-23: qty 1

## 2024-02-23 MED ORDER — BUPIVACAINE HCL (PF) 0.25 % IJ SOLN
INTRAMUSCULAR | Status: AC
Start: 2024-02-23 — End: 2024-02-23
  Filled 2024-02-23: qty 30

## 2024-02-23 MED ORDER — ACETAMINOPHEN 500 MG PO TABS
1000.0000 mg | ORAL_TABLET | ORAL | Status: AC
  Administered 2024-02-23: 1000 mg via ORAL

## 2024-02-23 MED ORDER — DEXAMETHASONE SOD PHOSPHATE PF 10 MG/ML IJ SOLN
INTRAMUSCULAR | Status: DC | PRN
  Administered 2024-02-23: 10 mg via INTRAVENOUS

## 2024-02-23 MED ORDER — KETOROLAC TROMETHAMINE 30 MG/ML IJ SOLN
INTRAMUSCULAR | Status: DC | PRN
Start: 2024-02-23 — End: 2024-02-23
  Administered 2024-02-23: 30 mg via INTRAVENOUS

## 2024-02-23 MED ORDER — PROPOFOL 10 MG/ML IV BOLUS
INTRAVENOUS | Status: AC
  Filled 2024-02-23: qty 20

## 2024-02-23 MED ORDER — ACETAMINOPHEN 500 MG PO TABS
ORAL_TABLET | ORAL | Status: AC
  Filled 2024-02-23: qty 2

## 2024-02-23 MED ORDER — LIDOCAINE 2% (20 MG/ML) 5 ML SYRINGE
INTRAMUSCULAR | Status: AC
  Filled 2024-02-23: qty 5

## 2024-02-23 SURGICAL SUPPLY — 35 items
BENZOIN TINCTURE PRP APPL 2/3 (GAUZE/BANDAGES/DRESSINGS) IMPLANT
BLADE CLIPPER SURG (BLADE) IMPLANT
BLADE SURG 15 STRL LF DISP TIS (BLADE) ×2 IMPLANT
CHLORAPREP W/TINT 26 (MISCELLANEOUS) ×2 IMPLANT
COVER BACK TABLE 60X90IN (DRAPES) ×2 IMPLANT
COVER MAYO STAND STRL (DRAPES) ×2 IMPLANT
DERMABOND ADVANCED .7 DNX12 (GAUZE/BANDAGES/DRESSINGS) ×2 IMPLANT
DRAPE LAPAROTOMY TRNSV 102X78 (DRAPES) ×2 IMPLANT
DRAPE UTILITY XL STRL (DRAPES) ×2 IMPLANT
DRSG TEGADERM 4X4.75 (GAUZE/BANDAGES/DRESSINGS) IMPLANT
ELECT COATED BLADE 2.86 ST (ELECTRODE) ×2 IMPLANT
ELECTRODE REM PT RTRN 9FT ADLT (ELECTROSURGICAL) ×2 IMPLANT
GAUZE SPONGE 4X4 12PLY STRL LF (GAUZE/BANDAGES/DRESSINGS) IMPLANT
GLOVE BIO SURGEON STRL SZ7.5 (GLOVE) ×2 IMPLANT
GOWN STRL REUS W/ TWL LRG LVL3 (GOWN DISPOSABLE) ×4 IMPLANT
NDL HYPO 22X1.5 SAFETY MO (MISCELLANEOUS) IMPLANT
NDL HYPO 25X1 1.5 SAFETY (NEEDLE) ×2 IMPLANT
NEEDLE HYPO 22X1.5 SAFETY MO (MISCELLANEOUS) IMPLANT
NEEDLE HYPO 25X1 1.5 SAFETY (NEEDLE) ×1 IMPLANT
PACK BASIN DAY SURGERY FS (CUSTOM PROCEDURE TRAY) ×2 IMPLANT
PENCIL SMOKE EVACUATOR (MISCELLANEOUS) ×2 IMPLANT
SLEEVE SCD COMPRESS KNEE MED (STOCKING) IMPLANT
SOLN 0.9% NACL POUR BTL 1000ML (IV SOLUTION) IMPLANT
SPIKE FLUID TRANSFER (MISCELLANEOUS) ×2 IMPLANT
SPONGE T-LAP 18X18 ~~LOC~~+RFID (SPONGE) ×2 IMPLANT
STRIP CLOSURE SKIN 1/2X4 (GAUZE/BANDAGES/DRESSINGS) IMPLANT
SUT MON AB 4-0 PC3 18 (SUTURE) ×2 IMPLANT
SUT NOVA 0 T19/GS 22DT (SUTURE) IMPLANT
SUT NOVA NAB GS-21 1 T12 (SUTURE) ×2 IMPLANT
SUT SILK 3 0 SH 30 (SUTURE) IMPLANT
SUT VIC AB 2-0 SH 27XBRD (SUTURE) ×2 IMPLANT
SUT VIC AB 3-0 54X BRD REEL (SUTURE) IMPLANT
SUT VIC AB 3-0 SH 27X BRD (SUTURE) IMPLANT
SYR CONTROL 10ML LL (SYRINGE) ×2 IMPLANT
TOWEL GREEN STERILE FF (TOWEL DISPOSABLE) ×4 IMPLANT

## 2024-02-23 NOTE — Op Note (Signed)
 02/23/2024  9:36 AM  PATIENT:  Joyce Chapman  38 y.o. female  PRE-OPERATIVE DIAGNOSIS:  UMBILICAL HERNIA  POST-OPERATIVE DIAGNOSIS:  UMBILICAL HERNIA  PROCEDURE:  Procedure(s) with comments: UMBILICAL HERNIA REPAIR   SURGEON:  Surgeons and Role:    * Curvin Deward MOULD, MD - Primary  PHYSICIAN ASSISTANT:   ASSISTANTS: none   ANESTHESIA:   local and general  EBL:  minimal   BLOOD ADMINISTERED:none  DRAINS: none   LOCAL MEDICATIONS USED:  MARCAINE      SPECIMEN:  No Specimen  DISPOSITION OF SPECIMEN:  N/A  COUNTS:  YES  TOURNIQUET:  * No tourniquets in log *  DICTATION: .Dragon Dictation  After informed consent was obtained the patient was brought to the operating room and placed in the supine position on the operating table.  After adequate induction of general anesthesia the patient's abdomen was prepped with ChloraPrep, allowed to dry, and draped in usual sterile manner.  An appropriate timeout was performed.  The area around the umbilicus was infiltrated with quarter percent Marcaine .  A small transversely oriented incision was then made.  The.  The incision was carried through the skin and subcutaneous tissue sharply with cautery.  Dissection was carried down to the fascia of the abdominal wall.  The stalk behind the umbilicus was opened sharply with the electrocautery.  There was only preperitoneal fat within the hernia defect.  This 4 separated from the back of the umbilicus and then reduced easily.  The fascial defect measured only 1 cm in areola with interrupted #1 Novafil stitches.  The wound was irrigated with copious amounts of saline.  The umbilicus was then tacked back to the fascia with an interrupted 2-0 Vicryl stitch.  The subcutaneous tissue was closed with interrupted 2-0 Vicryl stitches.  The skin was closed with interrupted 4-0 Monocryl subcuticular stitches.  Dermabond dressings were applied.  The patient tolerated the procedure well.  At the end of the case all  needle sponge and instrument counts were correct.  The patient was then awakened and taken to recovery in stable condition.  PLAN OF CARE: Discharge to home after PACU  PATIENT DISPOSITION:  PACU - hemodynamically stable.   Delay start of Pharmacological VTE agent (>24hrs) due to surgical blood loss or risk of bleeding: not applicable

## 2024-02-23 NOTE — Transfer of Care (Signed)
 Immediate Anesthesia Transfer of Care Note  Patient: Joyce Chapman  Procedure(s) Performed: REPAIR, HERNIA, UMBILICAL, ADULT  Patient Location: PACU  Anesthesia Type:General  Level of Consciousness: drowsy  Airway & Oxygen Therapy: Patient Spontanous Breathing and Patient connected to face mask oxygen  Post-op Assessment: Report given to RN and Post -op Vital signs reviewed and stable  Post vital signs: Reviewed and stable  Last Vitals:  Vitals Value Taken Time  BP 109/53 (69)   Temp    Pulse 67 02/23/24 09:48  Resp 7 02/23/24 09:48  SpO2 100 % 02/23/24 09:48  Vitals shown include unfiled device data.  Last Pain:  Vitals:   02/23/24 0703  TempSrc: Temporal  PainSc: 0-No pain      Patients Stated Pain Goal: 1 (02/23/24 0703)  Complications: No notable events documented.

## 2024-02-23 NOTE — H&P (Signed)
 REFERRING PHYSICIAN: Dannielle Bouchard, DO PROVIDER: DEWARD GARNETTE NULL, MD MRN: [id number] DOB: [birth year] Subjective   Chief Complaint: New Consultation  History of Present Illness: Joyce Chapman is a 38 y.o. female who is seen today as an office consultation for evaluation of New Consultation  We are asked to see the patient in consultation by Dr. Bouchard Dannielle to evaluate her for an umbilical hernia. The patient is a 38 year old white female who noticed a bulge at her umbilicus during her second pregnancy. She just delivered her third child and continues to notice a bulge at the umbilicus. She does have some occasional significant discomfort associated with it. She denies any nausea or vomiting. Her appetite seems good and her bowels are working normally.  Review of Systems: A complete review of systems was obtained from the patient. I have reviewed this information and discussed as appropriate with the patient. See HPI as well for other ROS.  ROS   Medical History: Past Medical History:  Diagnosis Date  Anxiety   Patient Active Problem List  Diagnosis  Umbilical hernia without obstruction or gangrene   History reviewed. No pertinent surgical history.   No Known Allergies  Current Outpatient Medications on File Prior to Visit  Medication Sig Dispense Refill  cholecalciferol 1000 unit tablet  prenatal 95-iron fum-folic-dha (PRENATAL + DHA) 28 mg iron-800 mcg-200 mg Cmpk   No current facility-administered medications on file prior to visit.   Family History  Problem Relation Age of Onset  High blood pressure (Hypertension) Mother  Diabetes Mother  Stroke Father  Myocardial Infarction (Heart attack) Father    Social History   Tobacco Use  Smoking Status Former  Types: Cigarettes  Smokeless Tobacco Never    Social History   Socioeconomic History  Marital status: Single  Tobacco Use  Smoking status: Former  Types: Cigarettes  Smokeless tobacco: Never  Vaping Use   Vaping status: Never Used  Substance and Sexual Activity  Alcohol use: Never  Drug use: Never   Social Drivers of Corporate Investment Banker Strain: Low Risk (09/15/2022)  Received from [facility]  Overall Financial Resource Strain (CARDIA)  Difficulty of Paying Living Expenses: Not very hard  Food Insecurity: No Food Insecurity (11/07/2023)  Received from [facility]  Hunger Vital Sign  Within the past 12 months, you worried that your food would run out before you got the money to buy more.: Never true  Within the past 12 months, the food you bought just didn't last and you didn't have money to get more.: Never true  Transportation Needs: No Transportation Needs (11/07/2023)  Received from [facility] - Transportation  In the past 12 months, has lack of transportation kept you from medical appointments or from getting medications?: No  In the past 12 months, has lack of transportation kept you from meetings, work, or from getting things needed for daily living?: No  Physical Activity: Insufficiently Active (09/15/2022)  Received from [facility]  Exercise Vital Sign  On average, how many days per week do you engage in moderate to strenuous exercise (like a brisk walk)?: 4 days  On average, how many minutes do you engage in exercise at this level?: 30 min  Stress: No Stress Concern Present (09/15/2022)  Received from [facility] - Occupational Stress Questionnaire  Feeling of Stress : Only a little  Social Connections: Moderately Integrated (09/15/2022)  Received from [facility]  Social Network  How would you rate  your social network (family, work, friends)?: Adequate participation with social networks  Housing Stability: Unknown (01/03/2024)  Housing Stability Vital Sign  Homeless in the Last Year: No   Objective:   Vitals:  BP: 132/71  Pulse: 76  Temp: 36.6 C (97.9 F)  SpO2: 99%  Weight: 56.5 kg (124 lb 9.6 oz)   Height: 160 cm (5' 3)  PainSc: 0-No pain   Body mass index is 22.07 kg/m.  Physical Exam Vitals reviewed.  Constitutional:  General: She is not in acute distress. Appearance: Normal appearance.  HENT:  Head: Normocephalic and atraumatic.  Right Ear: External ear normal.  Left Ear: External ear normal.  Nose: Nose normal.  Mouth/Throat:  Mouth: Mucous membranes are moist.  Pharynx: Oropharynx is clear.  Eyes:  General: No scleral icterus. Extraocular Movements: Extraocular movements intact.  Conjunctiva/sclera: Conjunctivae normal.  Pupils: Pupils are equal, round, and reactive to light.  Cardiovascular:  Rate and Rhythm: Normal rate and regular rhythm.  Pulses: Normal pulses.  Heart sounds: Normal heart sounds.  Pulmonary:  Effort: Pulmonary effort is normal. No respiratory distress.  Breath sounds: Normal breath sounds.  Abdominal:  General: Bowel sounds are normal.  Palpations: Abdomen is soft.  Tenderness: There is no abdominal tenderness.  Comments: There is a small reducible bulge at the umbilicus. The fascial defect appears to be about a centimeter.  Musculoskeletal:  General: No swelling, tenderness or deformity. Normal range of motion.  Cervical back: Normal range of motion and neck supple.  Skin: General: Skin is warm and dry.  Coloration: Skin is not jaundiced.  Neurological:  General: No focal deficit present.  Mental Status: She is alert and oriented to person, place, and time.  Psychiatric:  Mood and Affect: Mood normal.  Behavior: Behavior normal.     Labs, Imaging and Diagnostic Testing:  Assessment and Plan:   Diagnoses and all orders for this visit:  Umbilical hernia without obstruction or gangrene   The patient appears to have a small but symptomatic umbilical hernia. Because of the risk of incarceration and strangulation I feel she would benefit from having this fixed. She would also like to have this done. I have discussed with her in  detail the risks and benefits of the operation as well as some of the technical aspects including the possible use of mesh and the risk of chronic pain and she understands and wishes to proceed. We will move forward with surgical scheduling

## 2024-02-23 NOTE — Interval H&P Note (Signed)
 History and Physical Interval Note:  02/23/2024 8:26 AM  [facility]  has presented today for surgery, with the diagnosis of UMBILICAL HERNIA.  The various methods of treatment have been discussed with the patient and family. After consideration of risks, benefits and other options for treatment, the patient has consented to  Procedure(s) with comments: REPAIR, HERNIA, UMBILICAL, ADULT (N/A) - UMBILICAL HERNIA REPAIR WITH POSSIBLE MESH as a surgical intervention.  The patient's history has been reviewed, patient examined, no change in status, stable for surgery.  I have reviewed the patient's chart and labs.  Questions were answered to the patient's satisfaction.     Deward Null III

## 2024-02-23 NOTE — Anesthesia Procedure Notes (Signed)
 Procedure Name: Intubation Date/Time: 02/23/2024 9:01 AM  Performed by: Debarah Chiquita LABOR, CRNAPre-anesthesia Checklist: Patient identified, Emergency Drugs available, Suction available and Patient being monitored Patient Re-evaluated:Patient Re-evaluated prior to induction Oxygen Delivery Method: Circle system utilized Preoxygenation: Pre-oxygenation with 100% oxygen Induction Type: IV induction Ventilation: Mask ventilation without difficulty Laryngoscope Size: Mac and 3 Grade View: Grade I Tube type: Oral Tube size: 7.0 mm Number of attempts: 1 Airway Equipment and Method: Stylet and Bite block Placement Confirmation: ETT inserted through vocal cords under direct vision, positive ETCO2 and breath sounds checked- equal and bilateral Secured at: 21 cm Tube secured with: Tape Dental Injury: Teeth and Oropharynx as per pre-operative assessment

## 2024-02-23 NOTE — Anesthesia Postprocedure Evaluation (Signed)
 Anesthesia Post Note  Patient: Joyce Chapman  Procedure(s) Performed: REPAIR, HERNIA, UMBILICAL, ADULT     Patient location during evaluation: PACU Anesthesia Type: General Level of consciousness: awake and alert Pain management: pain level controlled Vital Signs Assessment: post-procedure vital signs reviewed and stable Respiratory status: spontaneous breathing, nonlabored ventilation, respiratory function stable and patient connected to nasal cannula oxygen Cardiovascular status: blood pressure returned to baseline and stable Postop Assessment: no apparent nausea or vomiting Anesthetic complications: no   No notable events documented.  Last Vitals:  Vitals:   02/23/24 1009 02/23/24 1028  BP: 112/73 118/61  Pulse: 67 71  Resp: 13 14  Temp:  36.8 C  SpO2: 95% 97%    Last Pain:  Vitals:   02/23/24 1021  TempSrc:   PainSc: 3                  Lynwood MARLA Cornea

## 2024-02-23 NOTE — Anesthesia Preprocedure Evaluation (Signed)
 Anesthesia Evaluation  Patient identified by MRN, date of birth, ID band Patient awake    Reviewed: Allergy & Precautions, NPO status   History of Anesthesia Complications (+) history of anesthetic complications  Airway Mallampati: II  TM Distance: >3 FB     Dental no notable dental hx.    Pulmonary neg COPD, former smoker   breath sounds clear to auscultation       Cardiovascular (-) angina (-) CAD, (-) Past MI, (-) Cardiac Stents and (-) DOE (-) dysrhythmias  Rhythm:Regular Rate:Normal     Neuro/Psych neg Seizures  Anxiety        GI/Hepatic ,,,(+) neg Cirrhosis        Endo/Other    Renal/GU Renal disease     Musculoskeletal   Abdominal   Peds  Hematology   Anesthesia Other Findings   Reproductive/Obstetrics                              Anesthesia Physical Anesthesia Plan  ASA: 2  Anesthesia Plan: General   Post-op Pain Management:    Induction: Intravenous  PONV Risk Score and Plan: 2 and Ondansetron  and Dexamethasone  Airway Management Planned: LMA  Additional Equipment:   Intra-op Plan:   Post-operative Plan:   Informed Consent: I have reviewed the patients History and Physical, chart, labs and discussed the procedure including the risks, benefits and alternatives for the proposed anesthesia with the patient or authorized representative who has indicated his/her understanding and acceptance.     Dental advisory given  Plan Discussed with: CRNA  Anesthesia Plan Comments:          Anesthesia Quick Evaluation

## 2024-02-23 NOTE — Discharge Instructions (Addendum)
  Post Anesthesia Home Care Instructions  Activity: Get plenty of rest for the remainder of the day. A responsible individual must stay with you for 24 hours following the procedure.  For the next 24 hours, DO NOT: -Drive a car -Advertising copywriter -Drink alcoholic beverages -Take any medication unless instructed by your physician -Make any legal decisions or sign important papers.  Meals: Start with liquid foods such as gelatin or soup. Progress to regular foods as tolerated. Avoid greasy, spicy, heavy foods. If nausea and/or vomiting occur, drink only clear liquids until the nausea and/or vomiting subsides. Call your physician if vomiting continues.  Special Instructions/Symptoms: Your throat may feel dry or sore from the anesthesia or the breathing tube placed in your throat during surgery. If this causes discomfort, gargle with warm salt water. The discomfort should disappear within 24 hours.  If you had a scopolamine patch placed behind your ear for the management of post- operative nausea and/or vomiting:  1. The medication in the patch is effective for 72 hours, after which it should be removed.  Wrap patch in a tissue and discard in the trash. Wash hands thoroughly with soap and water. 2. You may remove the patch earlier than 72 hours if you experience unpleasant side effects which may include dry mouth, dizziness or visual disturbances. 3. Avoid touching the patch. Wash your hands with soap and water after contact with the patch.     Next dose of Tylenol  may be given at 1:08pm if needed. Next dose of NSAIDs (Ibuprofen /Motrin Jeronimo) may be given at 5:30pm if needed. Oxycodone  given at 10:25am. Next dose may be given at 4:25pm if needed.

## 2024-02-24 ENCOUNTER — Encounter (HOSPITAL_BASED_OUTPATIENT_CLINIC_OR_DEPARTMENT_OTHER): Payer: Self-pay | Admitting: General Surgery
